# Patient Record
Sex: Female | Born: 1985
Health system: Southern US, Community
[De-identification: ages and names within clinical notes are randomized; demographics above are authoritative.]

## PROBLEM LIST (undated history)

## (undated) DIAGNOSIS — F322 Major depressive disorder, single episode, severe without psychotic features: Secondary | ICD-10-CM

## (undated) DIAGNOSIS — F419 Anxiety disorder, unspecified: Secondary | ICD-10-CM

## (undated) DIAGNOSIS — E119 Type 2 diabetes mellitus without complications: Secondary | ICD-10-CM

## (undated) DIAGNOSIS — D708 Other neutropenia: Secondary | ICD-10-CM

## (undated) HISTORY — PX: TONSILLECTOMY AND ADENOIDECTOMY: SHX28

## (undated) HISTORY — PX: KNEE SURGERY: SHX244

## (undated) HISTORY — PX: WISDOM TOOTH EXTRACTION: SHX21

## (undated) HISTORY — PX: TONSILLECTOMY: SUR1361

## (undated) HISTORY — DX: Anxiety disorder, unspecified: F41.9

---
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
## 2008-01-08 ENCOUNTER — Encounter: Admission: RE | Admit: 2008-01-08 | Discharge: 2008-01-08 | Payer: Self-pay | Admitting: Orthopedic Surgery

## 2014-06-15 LAB — HM DIABETES EYE EXAM

## 2014-07-15 LAB — HM PAP SMEAR: HM PAP: NORMAL

## 2014-07-27 ENCOUNTER — Inpatient Hospital Stay (HOSPITAL_BASED_OUTPATIENT_CLINIC_OR_DEPARTMENT_OTHER)
Admission: EM | Admit: 2014-07-27 | Discharge: 2014-07-30 | DRG: 639 | Disposition: A | Discharge status: Home / Self Care | Payer: BLUE CROSS/BLUE SHIELD | Attending: Family Medicine | Admitting: Family Medicine

## 2014-07-27 ENCOUNTER — Other Ambulatory Visit (HOSPITAL_BASED_OUTPATIENT_CLINIC_OR_DEPARTMENT_OTHER): Payer: Self-pay

## 2014-07-27 ENCOUNTER — Encounter (HOSPITAL_BASED_OUTPATIENT_CLINIC_OR_DEPARTMENT_OTHER): Payer: Self-pay | Admitting: *Deleted

## 2014-07-27 DIAGNOSIS — E1010 Type 1 diabetes mellitus with ketoacidosis without coma: Secondary | ICD-10-CM | POA: Insufficient documentation

## 2014-07-27 DIAGNOSIS — R0602 Shortness of breath: Secondary | ICD-10-CM | POA: Insufficient documentation

## 2014-07-27 DIAGNOSIS — E0810 Diabetes mellitus due to underlying condition with ketoacidosis without coma: Secondary | ICD-10-CM | POA: Diagnosis not present

## 2014-07-27 DIAGNOSIS — R109 Unspecified abdominal pain: Secondary | ICD-10-CM

## 2014-07-27 DIAGNOSIS — R531 Weakness: Secondary | ICD-10-CM | POA: Diagnosis present

## 2014-07-27 DIAGNOSIS — E1110 Type 2 diabetes mellitus with ketoacidosis without coma: Secondary | ICD-10-CM | POA: Diagnosis present

## 2014-07-27 DIAGNOSIS — E876 Hypokalemia: Secondary | ICD-10-CM | POA: Diagnosis present

## 2014-07-27 HISTORY — DX: Other neutropenia: D70.8

## 2014-07-27 HISTORY — DX: Type 2 diabetes mellitus without complications: E11.9

## 2014-07-27 LAB — BASIC METABOLIC PANEL
Anion gap: 18 — ABNORMAL HIGH (ref 5–15)
BUN: 8 mg/dL (ref 6–20)
CHLORIDE: 108 mmol/L (ref 101–111)
CO2: 8 mmol/L — AB (ref 22–32)
CREATININE: 0.81 mg/dL (ref 0.44–1.00)
Calcium: 8.3 mg/dL — ABNORMAL LOW (ref 8.9–10.3)
Glucose, Bld: 254 mg/dL — ABNORMAL HIGH (ref 65–99)
POTASSIUM: 3.2 mmol/L — AB (ref 3.5–5.1)
SODIUM: 134 mmol/L — AB (ref 135–145)

## 2014-07-27 LAB — CBC WITH DIFFERENTIAL/PLATELET
Basophils Absolute: 0 10*3/uL (ref 0.0–0.1)
Basophils Relative: 1 % (ref 0–1)
EOS ABS: 0 10*3/uL (ref 0.0–0.7)
EOS PCT: 1 % (ref 0–5)
HEMATOCRIT: 42 % (ref 36.0–46.0)
HEMOGLOBIN: 14.4 g/dL (ref 12.0–15.0)
LYMPHS ABS: 2.3 10*3/uL (ref 0.7–4.0)
LYMPHS PCT: 31 % (ref 12–46)
MCH: 33.3 pg (ref 26.0–34.0)
MCHC: 34.3 g/dL (ref 30.0–36.0)
MCV: 97.2 fL (ref 78.0–100.0)
Monocytes Absolute: 0.6 10*3/uL (ref 0.1–1.0)
Monocytes Relative: 8 % (ref 3–12)
NEUTROS PCT: 59 % (ref 43–77)
Neutro Abs: 4.5 10*3/uL (ref 1.7–7.7)
PLATELETS: 170 10*3/uL (ref 150–400)
RBC: 4.32 MIL/uL (ref 3.87–5.11)
RDW: 13.1 % (ref 11.5–15.5)
WBC: 7.4 10*3/uL (ref 4.0–10.5)

## 2014-07-27 LAB — URINALYSIS, ROUTINE W REFLEX MICROSCOPIC
BILIRUBIN URINE: NEGATIVE
Leukocytes, UA: NEGATIVE
Nitrite: NEGATIVE
PH: 5.5 (ref 5.0–8.0)
Protein, ur: 100 mg/dL — AB
Specific Gravity, Urine: 1.035 — ABNORMAL HIGH (ref 1.005–1.030)
Urobilinogen, UA: 0.2 mg/dL (ref 0.0–1.0)

## 2014-07-27 LAB — MAGNESIUM: Magnesium: 1.9 mg/dL (ref 1.7–2.4)

## 2014-07-27 LAB — HEPATIC FUNCTION PANEL
ALBUMIN: 3.8 g/dL (ref 3.5–5.0)
ALT: 16 U/L (ref 14–54)
AST: 15 U/L (ref 15–41)
Alkaline Phosphatase: 47 U/L (ref 38–126)
Bilirubin, Direct: <0.1 mg/dL — ABNORMAL LOW (ref 0.1–0.5)
TOTAL PROTEIN: 7.2 g/dL (ref 6.5–8.1)
Total Bilirubin: 1 mg/dL (ref 0.3–1.2)

## 2014-07-27 LAB — I-STAT ARTERIAL BLOOD GAS, ED
Acid-base deficit: 23 mmol/L — ABNORMAL HIGH (ref 0.0–2.0)
Bicarbonate: 5.4 mEq/L — ABNORMAL LOW (ref 20.0–24.0)
O2 Saturation: 94 %
Patient temperature: 97.3
TCO2: 6 mmol/L (ref 0–100)
pCO2 arterial: 17.9 mmHg — CL (ref 35.0–45.0)
pH, Arterial: 7.082 — CL (ref 7.350–7.450)
pO2, Arterial: 92 mmHg (ref 80.0–100.0)

## 2014-07-27 LAB — GLUCOSE, CAPILLARY: Glucose-Capillary: 206 mg/dL — ABNORMAL HIGH (ref 65–99)

## 2014-07-27 LAB — CBG MONITORING, ED
Glucose-Capillary: 259 mg/dL — ABNORMAL HIGH (ref 65–99)
Glucose-Capillary: 181 mg/dL — ABNORMAL HIGH (ref 65–99)
Glucose-Capillary: 178 mg/dL — ABNORMAL HIGH (ref 65–99)

## 2014-07-27 LAB — URINE MICROSCOPIC-ADD ON

## 2014-07-27 LAB — LIPASE, BLOOD: Lipase: 44 U/L (ref 22–51)

## 2014-07-27 LAB — I-STAT CG4 LACTIC ACID, ED: Lactic Acid, Venous: 1.33 mmol/L (ref 0.5–2.0)

## 2014-07-27 LAB — TROPONIN I: Troponin I: <0.03 ng/mL (ref <0.031)

## 2014-07-27 LAB — PREGNANCY, URINE: Preg Test, Ur: NEGATIVE

## 2014-07-27 MED ORDER — ACETAMINOPHEN 325 MG PO TABS
650.0000 mg | ORAL_TABLET | Freq: Once | ORAL | Status: AC
  Administered 2014-07-27: 650 mg via ORAL
  Filled 2014-07-27: qty 2

## 2014-07-27 MED ORDER — ONDANSETRON HCL 4 MG/2ML IJ SOLN
4.0000 mg | Freq: Once | INTRAMUSCULAR | Status: AC
  Administered 2014-07-27: 4 mg via INTRAVENOUS
  Filled 2014-07-27: qty 2

## 2014-07-27 MED ORDER — POTASSIUM CHLORIDE 10 MEQ/100ML IV SOLN
10.0000 meq | INTRAVENOUS | Status: DC
  Administered 2014-07-27 – 2014-07-28 (×4): 10 meq via INTRAVENOUS
  Filled 2014-07-27 (×4): qty 100

## 2014-07-27 MED ORDER — SODIUM CHLORIDE 0.9 % IV SOLN
INTRAVENOUS | Status: DC
  Administered 2014-07-27: 1.2 [IU]/h via INTRAVENOUS

## 2014-07-27 MED ORDER — SODIUM CHLORIDE 0.9 % IV SOLN
1000.0000 mL | INTRAVENOUS | Status: DC
Start: 2014-07-27 — End: 2014-07-28
  Administered 2014-07-27: 1000 mL via INTRAVENOUS

## 2014-07-27 MED ORDER — POTASSIUM CHLORIDE CRYS ER 20 MEQ PO TBCR
40.0000 meq | EXTENDED_RELEASE_TABLET | Freq: Once | ORAL | Status: AC
  Administered 2014-07-27: 40 meq via ORAL
  Filled 2014-07-27: qty 2

## 2014-07-27 MED ORDER — DEXTROSE-NACL 5-0.45 % IV SOLN
INTRAVENOUS | Status: DC
Start: 2014-07-27 — End: 2014-07-28
  Administered 2014-07-27: 21:00:00 via INTRAVENOUS

## 2014-07-27 MED ORDER — SODIUM CHLORIDE 0.9 % IV BOLUS (SEPSIS)
2000.0000 mL | Freq: Once | INTRAVENOUS | Status: AC
  Administered 2014-07-27: 2000 mL via INTRAVENOUS

## 2014-07-27 NOTE — ED Notes (Addendum)
[facility] as follows:   20G LAC: D5 0.45%NS @ 12650ml/hr and KCL 10meQ infusing at 100 ml/hr via 2 channel pump - compatible  20G RAC: NS bolus infusing with Insulin Regular gtt infusing @ 1.2 ml/hr via pump on one channel - compatible

## 2014-07-27 NOTE — ED Provider Notes (Signed)
CSN: [id number]     Arrival date & time 07/27/14  1853 History   First MD Initiated Contact with Patient 07/27/14 1859     Chief Complaint  Patient presents with  . Shortness of Breath     (Consider location/radiation/quality/duration/timing/severity/associated sxs/prior Treatment) HPI Michelle Riley is a 29 y.o. female who was recently diagnosed with type 1 diabetes on Tuesday and was initiated on metformin therapy. Patient is back on Monday to have insulin regimen started. States on Wednesday she began to have some nausea and is unsure if this was related to starting the metformin. On Friday she began to feel increasingly fatigued and endorses polyuria and polydipsia. Denies any abdominal pain, vomiting. However, she does report shortness of breath and increased work of breathing. She denies chest pain, numbness or weakness, recent travel or surgeries, hemoptysis, personal history of cancer, blood clot. Patient does use OCPs. CBG on arrival 259 Past Medical History  Diagnosis Date  . Diabetes mellitus without complication    Past Surgical History  Procedure Laterality Date  . Tonsillectomy     History reviewed. No pertinent family history. History  Substance Use Topics  . Smoking status: Never Smoker   . Smokeless tobacco: Not on file  . Alcohol Use: No   OB History    No data available     Review of Systems A 10 point review of systems was completed and was negative except for pertinent positives and negatives as mentioned in the history of present illness     Allergies  Review of patient's allergies indicates no known allergies.  Home Medications   Prior to Admission medications   Medication Sig Start Date End Date Taking? Authorizing Provider  metFORMIN (GLUCOPHAGE) 500 MG tablet Take by mouth 2 (two) times daily with a meal.   Yes Historical Provider, MD   BP 107/70 mmHg  Pulse 76  Temp(Src) 97.3 F (36.3 C) (Oral)  Resp 16  Ht 5\' 5"  (1.651 m)  Wt 116 lb  (52.617 kg)  BMI 19.30 kg/m2  SpO2 100%  LMP 07/20/2014 Physical Exam  Constitutional: She is oriented to person, place, and time. She appears well-developed and well-nourished.  HENT:  Head: Normocephalic and atraumatic.  Mouth/Throat: Oropharynx is clear and moist.  Eyes: Conjunctivae are normal. Pupils are equal, round, and reactive to light. Right eye exhibits no discharge. Left eye exhibits no discharge. No scleral icterus.  Neck: Normal range of motion. Neck supple.  Cardiovascular: Normal rate, regular rhythm and normal heart sounds.   Pulmonary/Chest: Effort normal and breath sounds normal. No respiratory distress. She has no wheezes. She has no rales.  Increased work of breathing noted. No accessory muscle use or nasal flaring. Lungs clear to auscultation bilaterally.  Abdominal: Soft.  Diffuse abdominal tenderness without focal tenderness. Mild distention. No rebound or guarding. No other lesions or deformities noted.  Musculoskeletal: She exhibits no tenderness.  Neurological: She is alert and oriented to person, place, and time.  Cranial Nerves II-XII grossly intact  Skin: Skin is warm and dry. No rash noted.  Psychiatric: She has a normal mood and affect.  Nursing note and vitals reviewed.   ED Course  Procedures (including critical care time) Labs Review Labs Reviewed  URINALYSIS, ROUTINE W REFLEX MICROSCOPIC - Abnormal; Notable for the following:    Specific Gravity, Urine 1.035 (*)    Glucose, UA >1000 (*)    Hgb urine dipstick TRACE (*)    Ketones, ur >80 (*)    Protein,  ur 100 (*)    All other components within normal limits  BASIC METABOLIC PANEL - Abnormal; Notable for the following:    Sodium 134 (*)    Potassium 3.2 (*)    CO2 8 (*)    Glucose, Bld 254 (*)    Calcium 8.3 (*)    Anion gap 18 (*)    All other components within normal limits  HEPATIC FUNCTION PANEL - Abnormal; Notable for the following:    Bilirubin, Direct <0.1 (*)    All other  components within normal limits  CBG MONITORING, ED - Abnormal; Notable for the following:    Glucose-Capillary 259 (*)    All other components within normal limits  CBG MONITORING, ED - Abnormal; Notable for the following:    Glucose-Capillary 181 (*)    All other components within normal limits  I-STAT ARTERIAL BLOOD GAS, ED - Abnormal; Notable for the following:    pH, Arterial 7.082 (*)    pCO2 arterial 17.9 (*)    Bicarbonate 5.4 (*)    Acid-base deficit 23.0 (*)    All other components within normal limits  CBG MONITORING, ED - Abnormal; Notable for the following:    Glucose-Capillary 178 (*)    All other components within normal limits  PREGNANCY, URINE  CBC WITH DIFFERENTIAL/PLATELET  URINE MICROSCOPIC-ADD ON  MAGNESIUM  TROPONIN I  LIPASE, BLOOD  BLOOD GAS, ARTERIAL  I-STAT CG4 LACTIC ACID, ED    Imaging Review No results found.   EKG Interpretation None     Meds given in ED:  Medications  insulin regular (NOVOLIN R,HUMULIN R) 250 Units in sodium chloride 0.9 % 250 mL (1 Units/mL) infusion (1.2 Units/hr Intravenous Rate/Dose Change 07/27/14 2140)  dextrose 5 %-0.45 % sodium chloride infusion ( Intravenous New Bag/Given 07/27/14 2121)  0.9 %  sodium chloride infusion (1,000 mLs Intravenous New Bag/Given 07/27/14 2015)  potassium chloride 10 mEq in 100 mL IVPB (10 mEq Intravenous New Bag/Given 07/27/14 2030)  sodium chloride 0.9 % bolus 2,000 mL (0 mLs Intravenous Stopped 07/27/14 2031)  ondansetron (ZOFRAN) injection 4 mg (4 mg Intravenous Given 07/27/14 1939)  potassium chloride SA (K-DUR,KLOR-CON) CR tablet 40 mEq (40 mEq Oral Given 07/27/14 2121)  ondansetron (ZOFRAN) injection 4 mg (4 mg Intravenous Given 07/27/14 2121)  acetaminophen (TYLENOL) tablet 650 mg (650 mg Oral Given 07/27/14 2121)    New Prescriptions   No medications on file   Filed Vitals:   07/27/14 1945 07/27/14 2035 07/27/14 2133 07/27/14 2200  BP:   118/82 107/70  Pulse: 84  77 76  Temp:       TempSrc:      Resp: 18  17 16   Height:      Weight:  116 lb (52.617 kg)    SpO2: 100%  100% 100%    MDM  Otherwise healthy, 29 year old female with newly diagnosed type 1 diabetes comes in for evaluation of fatigue and shortness of breath. Patient found to be in DKA. ABG shows pH of 7.07, PCO2 18.5 bicarbonate 5.4. Ketones in urine. Gap of 18.  CBG on arrival 259, vital signs stable and afebrile.  Diffuse abdominal tenderness with nausea. No focal tenderness and I doubt any acute/infectious intra-abdominal pathology. Respirations have improved after IV fluids and insulin in the ED, no tachypnea. Patient started on glucose stabilizer in the ED. I discussed and reviewed this case with my attending, Dr. Juleen ChinaKohut who also saw and evaluated the patient and agrees with plan for admission for  DKA. Consultation internal medicine, Dr. Algie Coffer, patient admitted to [facility]down at [facility]. Dr. Algie Coffer requests LFTs and lipase and we will oblige. Patient is stable, nontoxic in appearance and appropriate for transfer at this time.  Final diagnoses:  Diabetic ketoacidosis without coma associated with type 1 diabetes mellitus        Joycie Peek, PA-C 07/27/14 2238  Raeford Razor, MD 07/27/14 [phone]

## 2014-07-27 NOTE — ED Notes (Signed)
Pt c/o SOb x 3 days ne dx DM x 1 week ago  BS 270

## 2014-07-28 ENCOUNTER — Inpatient Hospital Stay (HOSPITAL_COMMUNITY): Payer: BLUE CROSS/BLUE SHIELD

## 2014-07-28 ENCOUNTER — Encounter (HOSPITAL_COMMUNITY): Payer: Self-pay | Admitting: Internal Medicine

## 2014-07-28 DIAGNOSIS — E1010 Type 1 diabetes mellitus with ketoacidosis without coma: Secondary | ICD-10-CM | POA: Insufficient documentation

## 2014-07-28 DIAGNOSIS — R0602 Shortness of breath: Secondary | ICD-10-CM | POA: Insufficient documentation

## 2014-07-28 DIAGNOSIS — E1310 Other specified diabetes mellitus with ketoacidosis without coma: Secondary | ICD-10-CM

## 2014-07-28 LAB — BASIC METABOLIC PANEL
ANION GAP: 7 (ref 5–15)
Anion gap: 10 (ref 5–15)
Anion gap: 7 (ref 5–15)
Anion gap: 10 (ref 5–15)
Anion gap: 11 (ref 5–15)
BUN: <5 mg/dL — ABNORMAL LOW (ref 6–20)
BUN: <5 mg/dL — ABNORMAL LOW (ref 6–20)
BUN: <5 mg/dL — ABNORMAL LOW (ref 6–20)
BUN: <5 mg/dL — ABNORMAL LOW (ref 6–20)
CALCIUM: 6.8 mg/dL — AB (ref 8.9–10.3)
CALCIUM: 7.3 mg/dL — AB (ref 8.9–10.3)
CHLORIDE: 113 mmol/L — AB (ref 101–111)
CO2: 10 mmol/L — AB (ref 22–32)
CO2: 13 mmol/L — ABNORMAL LOW (ref 22–32)
CO2: 15 mmol/L — AB (ref 22–32)
CO2: 13 mmol/L — ABNORMAL LOW (ref 22–32)
CO2: 15 mmol/L — ABNORMAL LOW (ref 22–32)
Calcium: 7.8 mg/dL — ABNORMAL LOW (ref 8.9–10.3)
Calcium: 7.9 mg/dL — ABNORMAL LOW (ref 8.9–10.3)
Calcium: 7.8 mg/dL — ABNORMAL LOW (ref 8.9–10.3)
Chloride: 112 mmol/L — ABNORMAL HIGH (ref 101–111)
Chloride: 112 mmol/L — ABNORMAL HIGH (ref 101–111)
Chloride: 111 mmol/L (ref 101–111)
Chloride: 108 mmol/L (ref 101–111)
Creatinine, Ser: 0.73 mg/dL (ref 0.44–1.00)
Creatinine, Ser: 0.65 mg/dL (ref 0.44–1.00)
Creatinine, Ser: 0.61 mg/dL (ref 0.44–1.00)
Creatinine, Ser: 0.7 mg/dL (ref 0.44–1.00)
Creatinine, Ser: 0.61 mg/dL (ref 0.44–1.00)
GFR calc Af Amer: >60 mL/min (ref >60)
GFR calc Af Amer: >60 mL/min (ref >60)
GFR calc Af Amer: >60 mL/min (ref >60)
GFR calc Af Amer: >60 mL/min (ref >60)
GFR calc Af Amer: >60 mL/min (ref >60)
GFR calc non Af Amer: >60 mL/min (ref >60)
GFR calc non Af Amer: >60 mL/min (ref >60)
GFR calc non Af Amer: >60 mL/min (ref >60)
GFR calc non Af Amer: >60 mL/min (ref >60)
GLUCOSE: 157 mg/dL — AB (ref 65–99)
Glucose, Bld: 132 mg/dL — ABNORMAL HIGH (ref 65–99)
Glucose, Bld: 139 mg/dL — ABNORMAL HIGH (ref 65–99)
Glucose, Bld: 210 mg/dL — ABNORMAL HIGH (ref 65–99)
Glucose, Bld: 190 mg/dL — ABNORMAL HIGH (ref 65–99)
POTASSIUM: 2.8 mmol/L — AB (ref 3.5–5.1)
POTASSIUM: 3.1 mmol/L — AB (ref 3.5–5.1)
Potassium: 2.4 mmol/L — CL (ref 3.5–5.1)
Potassium: 3 mmol/L — ABNORMAL LOW (ref 3.5–5.1)
Potassium: 2.9 mmol/L — ABNORMAL LOW (ref 3.5–5.1)
Sodium: 132 mmol/L — ABNORMAL LOW (ref 135–145)
Sodium: 133 mmol/L — ABNORMAL LOW (ref 135–145)
Sodium: 134 mmol/L — ABNORMAL LOW (ref 135–145)
Sodium: 134 mmol/L — ABNORMAL LOW (ref 135–145)
Sodium: 134 mmol/L — ABNORMAL LOW (ref 135–145)

## 2014-07-28 LAB — GLUCOSE, CAPILLARY
GLUCOSE-CAPILLARY: 146 mg/dL — AB (ref 65–99)
GLUCOSE-CAPILLARY: 129 mg/dL — AB (ref 65–99)
GLUCOSE-CAPILLARY: 116 mg/dL — AB (ref 65–99)
GLUCOSE-CAPILLARY: 163 mg/dL — AB (ref 65–99)
GLUCOSE-CAPILLARY: 178 mg/dL — AB (ref 65–99)
GLUCOSE-CAPILLARY: 126 mg/dL — AB (ref 65–99)
GLUCOSE-CAPILLARY: 154 mg/dL — AB (ref 65–99)
Glucose-Capillary: 155 mg/dL — ABNORMAL HIGH (ref 65–99)
Glucose-Capillary: 160 mg/dL — ABNORMAL HIGH (ref 65–99)
Glucose-Capillary: 146 mg/dL — ABNORMAL HIGH (ref 65–99)
Glucose-Capillary: 199 mg/dL — ABNORMAL HIGH (ref 65–99)
Glucose-Capillary: 132 mg/dL — ABNORMAL HIGH (ref 65–99)
Glucose-Capillary: 138 mg/dL — ABNORMAL HIGH (ref 65–99)
Glucose-Capillary: 151 mg/dL — ABNORMAL HIGH (ref 65–99)
Glucose-Capillary: 154 mg/dL — ABNORMAL HIGH (ref 65–99)
Glucose-Capillary: 218 mg/dL — ABNORMAL HIGH (ref 65–99)
Glucose-Capillary: 145 mg/dL — ABNORMAL HIGH (ref 65–99)
Glucose-Capillary: 192 mg/dL — ABNORMAL HIGH (ref 65–99)
Glucose-Capillary: 199 mg/dL — ABNORMAL HIGH (ref 65–99)
Glucose-Capillary: 209 mg/dL — ABNORMAL HIGH (ref 65–99)
Glucose-Capillary: 126 mg/dL — ABNORMAL HIGH (ref 65–99)
Glucose-Capillary: 161 mg/dL — ABNORMAL HIGH (ref 65–99)

## 2014-07-28 LAB — CBC WITH DIFFERENTIAL/PLATELET
Basophils Absolute: 0 10*3/uL (ref 0.0–0.1)
Basophils Relative: 1 % (ref 0–1)
EOS ABS: 0.1 10*3/uL (ref 0.0–0.7)
Eosinophils Relative: 2 % (ref 0–5)
HCT: 35.4 % — ABNORMAL LOW (ref 36.0–46.0)
Hemoglobin: 12.1 g/dL (ref 12.0–15.0)
LYMPHS PCT: 40 % (ref 12–46)
Lymphs Abs: 2.3 10*3/uL (ref 0.7–4.0)
MCH: 32.2 pg (ref 26.0–34.0)
MCHC: 34.2 g/dL (ref 30.0–36.0)
MCV: 94.1 fL (ref 78.0–100.0)
Monocytes Absolute: 0.6 10*3/uL (ref 0.1–1.0)
Monocytes Relative: 10 % (ref 3–12)
NEUTROS PCT: 47 % (ref 43–77)
Neutro Abs: 2.8 10*3/uL (ref 1.7–7.7)
Platelets: 146 10*3/uL — ABNORMAL LOW (ref 150–400)
RBC: 3.76 MIL/uL — ABNORMAL LOW (ref 3.87–5.11)
RDW: 13.6 % (ref 11.5–15.5)
WBC: 5.8 10*3/uL (ref 4.0–10.5)

## 2014-07-28 LAB — SALICYLATE LEVEL: Salicylate Lvl: <4 mg/dL (ref 2.8–30.0)

## 2014-07-28 LAB — CBC
HCT: 35.3 % — ABNORMAL LOW (ref 36.0–46.0)
HEMOGLOBIN: 12 g/dL (ref 12.0–15.0)
MCH: 32.3 pg (ref 26.0–34.0)
MCHC: 34 g/dL (ref 30.0–36.0)
MCV: 94.9 fL (ref 78.0–100.0)
PLATELETS: 145 10*3/uL — AB (ref 150–400)
RBC: 3.72 MIL/uL — ABNORMAL LOW (ref 3.87–5.11)
RDW: 13.5 % (ref 11.5–15.5)
WBC: 6.5 10*3/uL (ref 4.0–10.5)

## 2014-07-28 LAB — TROPONIN I: Troponin I: <0.03 ng/mL (ref <0.031)

## 2014-07-28 LAB — MRSA PCR SCREENING: MRSA by PCR: NEGATIVE

## 2014-07-28 MED ORDER — DEXTROSE-NACL 5-0.45 % IV SOLN
INTRAVENOUS | Status: DC
  Administered 2014-07-28 – 2014-07-29 (×3): via INTRAVENOUS

## 2014-07-28 MED ORDER — POTASSIUM CHLORIDE 10 MEQ/100ML IV SOLN
10.0000 meq | INTRAVENOUS | Status: AC
  Administered 2014-07-28 (×2): 10 meq via INTRAVENOUS
  Filled 2014-07-28 (×2): qty 100

## 2014-07-28 MED ORDER — INSULIN REGULAR HUMAN 100 UNIT/ML IJ SOLN: INTRAMUSCULAR | Status: DC

## 2014-07-28 MED ORDER — METOCLOPRAMIDE HCL 5 MG/ML IJ SOLN
10.0000 mg | Freq: Once | INTRAMUSCULAR | Status: AC
  Administered 2014-07-28: 10 mg via INTRAVENOUS
  Filled 2014-07-28: qty 2

## 2014-07-28 MED ORDER — DIPHENHYDRAMINE HCL 50 MG/ML IJ SOLN
25.0000 mg | Freq: Once | INTRAMUSCULAR | Status: AC
  Administered 2014-07-28: 25 mg via INTRAVENOUS
  Filled 2014-07-28: qty 1

## 2014-07-28 MED ORDER — MORPHINE SULFATE 2 MG/ML IJ SOLN
1.0000 mg | Freq: Once | INTRAMUSCULAR | Status: AC
  Administered 2014-07-28: 1 mg via INTRAVENOUS
  Filled 2014-07-28: qty 1

## 2014-07-28 MED ORDER — KETOROLAC TROMETHAMINE 15 MG/ML IJ SOLN
15.0000 mg | Freq: Once | INTRAMUSCULAR | Status: AC
  Administered 2014-07-28: 15 mg via INTRAVENOUS
  Filled 2014-07-28: qty 1

## 2014-07-28 MED ORDER — POTASSIUM CHLORIDE 10 MEQ/100ML IV SOLN: 10.0000 meq | INTRAVENOUS | Status: DC

## 2014-07-28 MED ORDER — KETOROLAC TROMETHAMINE 30 MG/ML IJ SOLN
30.0000 mg | Freq: Once | INTRAMUSCULAR | Status: AC
  Administered 2014-07-28: 30 mg via INTRAVENOUS
  Filled 2014-07-28: qty 1

## 2014-07-28 MED ORDER — SODIUM CHLORIDE 0.9 % IV SOLN
INTRAVENOUS | Status: DC
  Administered 2014-07-29 (×2): via INTRAVENOUS

## 2014-07-28 MED ORDER — POTASSIUM CHLORIDE CRYS ER 20 MEQ PO TBCR: 60.0000 meq | EXTENDED_RELEASE_TABLET | Freq: Once | ORAL | Status: DC

## 2014-07-28 MED ORDER — POTASSIUM CHLORIDE 10 MEQ/100ML IV SOLN
10.0000 meq | INTRAVENOUS | Status: AC
  Administered 2014-07-28 (×2): 10 meq via INTRAVENOUS
  Filled 2014-07-28 (×3): qty 100

## 2014-07-28 MED ORDER — POTASSIUM CHLORIDE 10 MEQ/100ML IV SOLN
10.0000 meq | INTRAVENOUS | Status: AC
  Administered 2014-07-28 (×2): 10 meq via INTRAVENOUS
  Filled 2014-07-28: qty 100

## 2014-07-28 MED ORDER — POTASSIUM CHLORIDE CRYS ER 20 MEQ PO TBCR
60.0000 meq | EXTENDED_RELEASE_TABLET | Freq: Once | ORAL | Status: AC
  Administered 2014-07-28: 60 meq via ORAL
  Filled 2014-07-28: qty 3

## 2014-07-28 MED ORDER — IBUPROFEN 200 MG PO TABS
600.0000 mg | ORAL_TABLET | Freq: Once | ORAL | Status: AC | PRN
  Administered 2014-07-28: 600 mg via ORAL
  Filled 2014-07-28: qty 3

## 2014-07-28 MED ORDER — ACETAMINOPHEN 325 MG PO TABS
650.0000 mg | ORAL_TABLET | Freq: Four times a day (QID) | ORAL | Status: DC | PRN
Start: 2014-07-28 — End: 2014-07-30
  Administered 2014-07-28 – 2014-07-29 (×2): 650 mg via ORAL
  Filled 2014-07-28: qty 2

## 2014-07-28 MED ORDER — METOCLOPRAMIDE HCL 5 MG/ML IJ SOLN
5.0000 mg | Freq: Once | INTRAMUSCULAR | Status: AC
  Administered 2014-07-28: 5 mg via INTRAVENOUS
  Filled 2014-07-28: qty 1

## 2014-07-28 MED ORDER — POTASSIUM CHLORIDE 10 MEQ/100ML IV SOLN
10.0000 meq | INTRAVENOUS | Status: AC
  Administered 2014-07-28 (×3): 10 meq via INTRAVENOUS
  Filled 2014-07-28 (×3): qty 100

## 2014-07-28 MED ORDER — ENOXAPARIN SODIUM 40 MG/0.4ML ~~LOC~~ SOLN
40.0000 mg | Freq: Every day | SUBCUTANEOUS | Status: DC
  Administered 2014-07-28 – 2014-07-29 (×2): 40 mg via SUBCUTANEOUS
  Filled 2014-07-28 (×3): qty 0.4

## 2014-07-28 NOTE — Progress Notes (Signed)
Critical K 2.4 called to [facility], ordered to receive 4 runs KCL.

## 2014-07-28 NOTE — H&P (Signed)
[facility] History and Physical  Michelle Riley WUJ:[id number] DOB: [birth year] DOA: 07/27/2014  Referring physician: Patient was transferred from [facility] [place]. PCP: No primary care provider on file. [facility] [facility] [place] [facility]. Specialists: None.  Chief Complaint: Weakness and fatigue and shortness of breath.  HPI: Michelle Riley is a 29 y.o. female who was diagnosed with diabetes mellitus and placed on metformin 2 days ago presents to the ER because of weakness fatigue and nausea. Patient also has been having shortness of breath. All these symptoms started yesterday morning. In the ER patient was found to have elevated blood sugar with anion gap. Patient was given 3 L normal saline bolus and started on IV insulin infusion for DKA. Patient denies any chest pain vomiting diarrhea fever chills. Patient on exam has abdominal tenderness in the both upper quadrants. Denies any dysuria or discharges. Patient at this time on my exam states her shortness of breath has largely resolved.    Review of Systems: As presented in the history of presenting illness, rest negative.  Past Medical History  Diagnosis Date  . Diabetes mellitus without complication   . Chronic benign neutropenia    Past Surgical History  Procedure Laterality Date  . Tonsillectomy    . Knee surgery    . Tonsillectomy and adenoidectomy     Social History:  reports that she has never smoked. She does not have any smokeless tobacco history on file. She reports that she does not drink alcohol or use illicit drugs. Where does patient livehome. Can patient participate in ADLs? yes. No Known Allergies  Family History:  Family History  Problem Relation Age of Onset  . Diabetes Mellitus II Maternal Grandmother   . Diabetes Mellitus II Paternal Grandfather   . Leukemia Paternal Grandfather       Prior to Admission medications   Medication Sig Start Date End Date Taking? Authorizing  Provider  metFORMIN (GLUCOPHAGE) 500 MG tablet Take by mouth 2 (two) times daily with a meal.   Yes Historical Provider, MD    Physical Exam: Filed Vitals:   07/27/14 2035 07/27/14 2133 07/27/14 2200 07/27/14 2315  BP:  118/82 107/70 97/65  Pulse:  77 76 83  Temp:    98.1 F (36.7 C)  TempSrc:    Oral  Resp:  17 16 16   Height:      Weight: 52.617 kg (116 lb)     SpO2:  100% 100% 100%     General:  Well-developed and nourished.  Eyes: Anicteric no pallor.  ENT: No discharge from the ears eyes nose or mouth.  Neck: No mass felt.  Cardiovascular: S1-S2 heard.  Respiratory: No rhonchi or crepitations.  Abdomen: Mild tenderness in the both upper quadrants. No guarding or rigidity.  Skin: No rash.  Musculoskeletal: No edema.  Psychiatric: Appears normal.  Neurologic: Alert awake oriented to time place and person. Moves all extremities.  Labs on Admission:  Basic Metabolic Panel:  Recent Labs Lab 07/27/14 1915  NA 134*  K 3.2*  CL 108  CO2 8*  GLUCOSE 254*  BUN 8  CREATININE 0.81  CALCIUM 8.3*  MG 1.9   Liver Function Tests:  Recent Labs Lab 07/27/14 1909  AST 15  ALT 16  ALKPHOS 47  BILITOT 1.0  PROT 7.2  ALBUMIN 3.8    Recent Labs Lab 07/27/14 1909  LIPASE 44   No results for input(s): AMMONIA in the last 168 hours. CBC:  Recent  Labs Lab 07/27/14 1915  WBC 7.4  NEUTROABS 4.5  HGB 14.4  HCT 42.0  MCV 97.2  PLT 170   Cardiac Enzymes:  Recent Labs Lab 07/27/14 1915  TROPONINI <0.03    BNP (last 3 results) No results for input(s): BNP in the last 8760 hours.  ProBNP (last 3 results) No results for input(s): PROBNP in the last 8760 hours.  CBG:  Recent Labs Lab 07/27/14 1907 07/27/14 2017 07/27/14 2154 07/27/14 2309  GLUCAP 259* 181* 178* 206*    Radiological Exams on Admission: No results found.  EKG: Independently reviewed. Normal sinus rhythm with prolonged QT of 525 ms.  Assessment/Plan Principal  Problem:   DKA (diabetic ketoacidoses) Active Problems:   Diabetic ketoacidosis without coma associated with type 1 diabetes mellitus   1. Diabetic ketoacidosis probably type I - patient has been continued on IV insulin infusion with IV fluids. Closely follow metabolic panel. Once patient's anion gap is corrected we will change to long-acting insulin. Patient states her hemoglobin A1c checked 3 days ago was 13. Closely follow intake output and metabolic panel. Check salicylate levels. Chest x-ray is pending. 2. Abnormal EKG with prolonged QT - recheck EKG after correction of electrolytes. 3. Abdominal tenderness - patient has abdominal tenderness in the both upper quadrants. LFTs are normal. Check sonogram of abdomen.  Chest x-ray is pending.   DVT Prophylaxis Lovenox  Code Status: Full code.  Family Communication: Family at the bedside.  Disposition Plan: Admit to inpatient. Likely stay 2 days.    Lamario Mani N. [facility] Pager [phone].  If 7PM-7AM, please contact night-coverage [email or web address] Password [email or web address] 07/28/2014, 12:17 AM

## 2014-07-28 NOTE — Progress Notes (Addendum)
Initial Nutrition Assessment  DOCUMENTATION CODES:  Not applicable  INTERVENTION:   (Advance diet as medically appropriate, RD to add interventions accordingly)  NUTRITION DIAGNOSIS:  Inadequate oral intake related to inability to eat as evidenced by NPO status  GOAL:  Patient will meet greater than or equal to 90% of their needs  MONITOR:  Diet advancement, PO intake, Labs, Weight trends, I & O's  REASON FOR ASSESSMENT:  Malnutrition Screening Tool  ASSESSMENT: 29 y.o. Female who was diagnosed with diabetes mellitus and placed on metformin 2 days ago presents to the ER because of weakness fatigue and nausea.   RD unable to obtain nutrition hx.    Pt currently in ULTRASOUND.  Per Malnutrition Screening Tool Report, pt with recent weight loss without trying, eating poorly because of a decreased appetite.  Currently NPO.  RD to continue to follow, add supplements as needed.  RD unable to complete Nutrition Focused Physical Exam at this time.  Height:  Ht Readings from Last 1 Encounters:  07/27/14 5\' 5"  (1.651 m)    Weight:  Wt Readings from Last 1 Encounters:  07/27/14 116 lb (52.617 kg)    Ideal Body Weight:  56.8 kg  Wt Readings from Last 10 Encounters:  07/27/14 116 lb (52.617 kg)    BMI:  Body mass index is 19.3 kg/(m^2).  Estimated Nutritional Needs:  Kcal:  1700-1900  Protein:  80-90 gm  Fluid:  1.7-1.9 L  Skin:  Reviewed, no issues  Diet Order:  Diet NPO time specified  EDUCATION NEEDS:  No education needs identified at this time   Intake/Output Summary (Last 24 hours) at 07/28/14 1224 Last data filed at 07/28/14 1150  Gross per 24 hour  Intake 2408.76 ml  Output      0 ml  Net 2408.76 ml    Last BM:  unknown   Maureen ChattersKatie Nyara Capell, RD, LDN Pager #: [phone] After-Hours Pager #: [phone]

## 2014-07-28 NOTE — Progress Notes (Signed)
UR COMPLETED  

## 2014-07-28 NOTE — Progress Notes (Signed)
Patient seen and evaluated earlier the same by my associate. Please refer to H&P for details. Patient has required multiple potassium replacements. Still acidotic and as such will continue glucose stabilizer.  Gen: pt in nad, alert and awake CV: S1 and S2 within normal limits, no rubs Pulmonary: Clear to auscultation  Continue monitoring BMP q 4 hours. Once there is improvement in acidosis will plan on placing on SQ insulin regimen. Patient still complaining of nausea.  Jazarah Capili, [employer]

## 2014-07-28 NOTE — Care Management Note (Signed)
Case Management Note  Patient Details  Name: Michelle Riley MRN: [id number] Date of Birth: 1985-12-08  Subjective/Objective:             Pt  presents with weakness and fatigue,admitted with DKA  from home with family.      Action/Plan: Return to home when medically stable. CM to f/u with discharge needs.  Expected Discharge Date:                  Expected Discharge Plan:  Home/Self Care  In-House Referral:     Discharge planning Services  CM Consult  Post Acute Care Choice:    Choice offered to:     DME Arranged:    DME Agency:     HH Arranged:    HH Agency:     Status of Service:  In process, will continue to follow  Medicare Important Message Given:    Date Medicare IM Given:    Medicare IM give by:    Date Additional Medicare IM Given:    Additional Medicare Important Message give by:     If discussed at Long Length of Stay Meetings, dates discussed:    Additional Comments:    Emergency contact Michelle Riley (mother) [phone]  Epifanio LeschesCole, Amberia Bayless Hudson, RN 07/28/2014, 7:02 AM

## 2014-07-29 DIAGNOSIS — E1010 Type 1 diabetes mellitus with ketoacidosis without coma: Principal | ICD-10-CM

## 2014-07-29 DIAGNOSIS — E876 Hypokalemia: Secondary | ICD-10-CM

## 2014-07-29 LAB — BASIC METABOLIC PANEL
ANION GAP: 11 (ref 5–15)
ANION GAP: 10 (ref 5–15)
Anion gap: 10 (ref 5–15)
BUN: <5 mg/dL — ABNORMAL LOW (ref 6–20)
BUN: <5 mg/dL — ABNORMAL LOW (ref 6–20)
CALCIUM: 8 mg/dL — AB (ref 8.9–10.3)
CHLORIDE: 108 mmol/L (ref 101–111)
CO2: 17 mmol/L — ABNORMAL LOW (ref 22–32)
CO2: 19 mmol/L — AB (ref 22–32)
CO2: 19 mmol/L — ABNORMAL LOW (ref 22–32)
CREATININE: 0.71 mg/dL (ref 0.44–1.00)
Calcium: 7.8 mg/dL — ABNORMAL LOW (ref 8.9–10.3)
Calcium: 8 mg/dL — ABNORMAL LOW (ref 8.9–10.3)
Chloride: 108 mmol/L (ref 101–111)
Chloride: 108 mmol/L (ref 101–111)
Creatinine, Ser: 0.58 mg/dL (ref 0.44–1.00)
Creatinine, Ser: 0.46 mg/dL (ref 0.44–1.00)
GFR calc Af Amer: >60 mL/min (ref >60)
GFR calc non Af Amer: >60 mL/min (ref >60)
GFR calc non Af Amer: >60 mL/min (ref >60)
GFR calc non Af Amer: >60 mL/min (ref >60)
GLUCOSE: 176 mg/dL — AB (ref 65–99)
GLUCOSE: 388 mg/dL — AB (ref 65–99)
Glucose, Bld: 191 mg/dL — ABNORMAL HIGH (ref 65–99)
POTASSIUM: 3.4 mmol/L — AB (ref 3.5–5.1)
Potassium: 2.9 mmol/L — ABNORMAL LOW (ref 3.5–5.1)
Potassium: 2.8 mmol/L — ABNORMAL LOW (ref 3.5–5.1)
SODIUM: 138 mmol/L (ref 135–145)
SODIUM: 137 mmol/L (ref 135–145)
Sodium: 135 mmol/L (ref 135–145)

## 2014-07-29 LAB — GLUCOSE, CAPILLARY
GLUCOSE-CAPILLARY: 123 mg/dL — AB (ref 65–99)
GLUCOSE-CAPILLARY: 182 mg/dL — AB (ref 65–99)
GLUCOSE-CAPILLARY: 175 mg/dL — AB (ref 65–99)
Glucose-Capillary: 120 mg/dL — ABNORMAL HIGH (ref 65–99)
Glucose-Capillary: 149 mg/dL — ABNORMAL HIGH (ref 65–99)
Glucose-Capillary: 159 mg/dL — ABNORMAL HIGH (ref 65–99)
Glucose-Capillary: 160 mg/dL — ABNORMAL HIGH (ref 65–99)
Glucose-Capillary: 165 mg/dL — ABNORMAL HIGH (ref 65–99)
Glucose-Capillary: 166 mg/dL — ABNORMAL HIGH (ref 65–99)
Glucose-Capillary: 212 mg/dL — ABNORMAL HIGH (ref 65–99)
Glucose-Capillary: 219 mg/dL — ABNORMAL HIGH (ref 65–99)
Glucose-Capillary: 232 mg/dL — ABNORMAL HIGH (ref 65–99)
Glucose-Capillary: 318 mg/dL — ABNORMAL HIGH (ref 65–99)
Glucose-Capillary: 392 mg/dL — ABNORMAL HIGH (ref 65–99)

## 2014-07-29 MED ORDER — POTASSIUM CHLORIDE 10 MEQ/100ML IV SOLN
10.0000 meq | INTRAVENOUS | Status: AC
  Administered 2014-07-29 (×4): 10 meq via INTRAVENOUS
  Filled 2014-07-29 (×3): qty 100

## 2014-07-29 MED ORDER — INSULIN ASPART 100 UNIT/ML ~~LOC~~ SOLN
0.0000 [IU] | Freq: Three times a day (TID) | SUBCUTANEOUS | Status: DC
  Administered 2014-07-29: 2 [IU] via SUBCUTANEOUS
  Administered 2014-07-29: 7 [IU] via SUBCUTANEOUS
  Administered 2014-07-30: 2 [IU] via SUBCUTANEOUS
  Administered 2014-07-30: 3 [IU] via SUBCUTANEOUS

## 2014-07-29 MED ORDER — INSULIN GLARGINE 100 UNIT/ML ~~LOC~~ SOLN
5.0000 [IU] | Freq: Every day | SUBCUTANEOUS | Status: DC
  Administered 2014-07-29 – 2014-07-30 (×2): 5 [IU] via SUBCUTANEOUS
  Filled 2014-07-29 (×2): qty 0.05

## 2014-07-29 MED ORDER — INSULIN ASPART 100 UNIT/ML ~~LOC~~ SOLN
0.0000 [IU] | Freq: Every day | SUBCUTANEOUS | Status: DC
  Administered 2014-07-29: 5 [IU] via SUBCUTANEOUS

## 2014-07-29 MED ORDER — IBUPROFEN 200 MG PO TABS
600.0000 mg | ORAL_TABLET | Freq: Once | ORAL | Status: AC | PRN
Start: 2014-07-29 — End: 2014-07-29
  Administered 2014-07-29: 600 mg via ORAL
  Filled 2014-07-29: qty 3

## 2014-07-29 MED ORDER — POTASSIUM CHLORIDE CRYS ER 20 MEQ PO TBCR
40.0000 meq | EXTENDED_RELEASE_TABLET | Freq: Once | ORAL | Status: AC
  Administered 2014-07-29: 40 meq via ORAL
  Filled 2014-07-29: qty 2

## 2014-07-29 MED ORDER — POTASSIUM CHLORIDE 10 MEQ/100ML IV SOLN
10.0000 meq | INTRAVENOUS | Status: AC
  Administered 2014-07-29 (×4): 10 meq via INTRAVENOUS
  Filled 2014-07-29 (×4): qty 100

## 2014-07-29 NOTE — Progress Notes (Signed)
[facility] PROGRESS NOTE  Michelle LargeJessica D Riley UJW:[id number] DOB: 1985/12/07 DOA: 07/27/2014 PCP: No primary care provider on file.  Assessment/Plan: Principal Problem:   DKA (diabetic ketoacidoses) - Newly diagnosed diabetes - Patient was successfully transitioned off of insulin drip earlier this a.m. - Close metabolic anion gap although co2 still below normal limits but hypokalemia was affecting further use of insulin gtt.  - advance diet - place on Lantus with SSI and night coverage  Hypokalemia - replace orally and IV - Magnesium on last check normal  Code Status: full Family Communication: discussed with patient directly  Disposition Plan: Pending improvement in condition. Likely d/c next am if continued improvement in condition and glucose levels   Consultants:  None  Procedures:  None  Antibiotics:  None   HPI/Subjective: Pt has no new complaints no acute issues reported overnight.  Objective: Filed Vitals:   07/29/14 1547  BP: 104/62  Pulse: 85  Temp:   Resp: 18    Intake/Output Summary (Last 24 hours) at 07/29/14 1605 Last data filed at 07/29/14 1546  Gross per 24 hour  Intake 5418.73 ml  Output      0 ml  Net 5418.73 ml   Filed Weights   07/27/14 1858 07/27/14 2035 07/29/14 1547  Weight: 58.06 kg (128 lb) 52.617 kg (116 lb) 61.417 kg (135 lb 6.4 oz)    Exam:   General:  Pt in nad, alert and awake  Cardiovascular: No cyanosis  Respiratory: No increased work of breathing, no wheezes, equal chest rise  Abdomen: Nondistended, no guarding  Musculoskeletal: No cyanosis or clubbing, normal muscle tone bilaterally  Data Reviewed: Basic Metabolic Panel:  Recent Labs Lab 07/27/14 1915  07/28/14 1119 07/28/14 1630 07/28/14 2250 07/29/14 0245 07/29/14 1131  NA 134*  < > 134* 134* 134* 135 138  K 3.2*  < > 3.1* 3.0* 2.9* 2.9* 2.8*  CL 108  < > 112* 111 108 108 108  CO2 8*  < > 15* 13* 15* 17* 19*  GLUCOSE 254*  < > 139* 210*  190* 191* 176*  BUN 8  < > <5* <5* <5* <5* <5*  CREATININE 0.81  < > 0.61 0.70 0.61 0.58 0.46  CALCIUM 8.3*  < > 7.8* 7.9* 7.8* 7.8* 8.0*  MG 1.9  --   --   --   --   --   --   < > = values in this interval not displayed. Liver Function Tests:  Recent Labs Lab 07/27/14 1909  AST 15  ALT 16  ALKPHOS 47  BILITOT 1.0  PROT 7.2  ALBUMIN 3.8    Recent Labs Lab 07/27/14 1909  LIPASE 44   No results for input(s): AMMONIA in the last 168 hours. CBC:  Recent Labs Lab 07/27/14 1915 07/28/14 0106 07/28/14 0441  WBC 7.4 6.5 5.8  NEUTROABS 4.5  --  2.8  HGB 14.4 12.0 12.1  HCT 42.0 35.3* 35.4*  MCV 97.2 94.9 94.1  PLT 170 145* 146*   Cardiac Enzymes:  Recent Labs Lab 07/27/14 1915 07/28/14 0106  TROPONINI <0.03 <0.03   BNP (last 3 results) No results for input(s): BNP in the last 8760 hours.  ProBNP (last 3 results) No results for input(s): PROBNP in the last 8760 hours.  CBG:  Recent Labs Lab 07/29/14 0643 07/29/14 0746 07/29/14 0843 07/29/14 0946 07/29/14 1123  GLUCAP 160* 149* 166* 182* 175*    Recent Results (from the past 240 hour(s))  MRSA PCR Screening  Status: None   Collection Time: 07/27/14 11:38 PM  Result Value Ref Range Status   MRSA by PCR NEGATIVE NEGATIVE Final    Comment:        The GeneXpert MRSA Assay (FDA approved for NASAL specimens only), is one component of a comprehensive MRSA colonization surveillance program. It is not intended to diagnose MRSA infection nor to guide or monitor treatment for MRSA infections.      Studies: [place] Abdomen Complete  07/28/2014   CLINICAL DATA:  Abdominal pain.  EXAM: ULTRASOUND ABDOMEN COMPLETE  COMPARISON:  None.  FINDINGS: Gallbladder: No gallstones or wall thickening visualized. No sonographic Murphy sign noted.  Common bile duct: Diameter: 3.3 mm  Liver: No focal lesion identified. Within normal limits in parenchymal echogenicity.  IVC: No abnormality visualized.  Pancreas: Visualized  portion unremarkable.  Spleen: Size and appearance within normal limits.  Right Kidney: Length: 11.6 cm. Echogenicity within normal limits. No mass or hydronephrosis visualized.  Left Kidney: Length: 11.5 cm. Echogenicity within normal limits. No mass or hydronephrosis visualized.  Abdominal aorta: No aneurysm visualized.  Other findings: None.  IMPRESSION: Normal abdominal ultrasound.   Electronically Signed   By: Amie Portlandavid  Ormond M.D.   On: 07/28/2014 09:23   Dg Chest Port 1 View  07/28/2014   CLINICAL DATA:  Shortness of breath, weakness, and nausea for 2 days.  EXAM: PORTABLE CHEST - 1 VIEW  COMPARISON:  None.  FINDINGS: The heart size and mediastinal contours are within normal limits. Both lungs are clear. The visualized skeletal structures are unremarkable.  IMPRESSION: No active disease.   Electronically Signed   By: Burman NievesWilliam  Stevens M.D.   On: 07/28/2014 05:31    Scheduled Meds: . enoxaparin (LOVENOX) injection  40 mg Subcutaneous Daily  . insulin aspart  0-5 Units Subcutaneous QHS  . insulin aspart  0-9 Units Subcutaneous TID WC  . insulin glargine  5 Units Subcutaneous Daily  . potassium chloride  10 mEq Intravenous Q1 Hr x 4   Continuous Infusions: . sodium chloride 125 mL/hr at 07/29/14 1317  . dextrose 5 % and 0.45% NaCl 100 mL/hr at 07/29/14 0033  . insulin (NOVOLIN-R) infusion Stopped (07/29/14 1035)     Time spent: > 35 minutes    Penny PiaVEGA, Aislinn Feliz  [facility] Pager [phone]  If 7PM-7AM, please contact night-coverage at [email or web address], password [email or web address] 07/29/2014, 4:05 PM  LOS: 2 days

## 2014-07-30 DIAGNOSIS — E0810 Diabetes mellitus due to underlying condition with ketoacidosis without coma: Secondary | ICD-10-CM

## 2014-07-30 LAB — BASIC METABOLIC PANEL
ANION GAP: 9 (ref 5–15)
BUN: <5 mg/dL — ABNORMAL LOW (ref 6–20)
CALCIUM: 7.9 mg/dL — AB (ref 8.9–10.3)
CO2: 22 mmol/L (ref 22–32)
Chloride: 109 mmol/L (ref 101–111)
Creatinine, Ser: 0.6 mg/dL (ref 0.44–1.00)
GFR calc Af Amer: >60 mL/min (ref >60)
GFR calc non Af Amer: >60 mL/min (ref >60)
GLUCOSE: 263 mg/dL — AB (ref 65–99)
Potassium: 3.3 mmol/L — ABNORMAL LOW (ref 3.5–5.1)
Sodium: 140 mmol/L (ref 135–145)

## 2014-07-30 LAB — GLUCOSE, CAPILLARY
GLUCOSE-CAPILLARY: 248 mg/dL — AB (ref 65–99)
Glucose-Capillary: 198 mg/dL — ABNORMAL HIGH (ref 65–99)

## 2014-07-30 MED ORDER — INSULIN STARTER KIT- PEN NEEDLES (ENGLISH)
1.0000 | Freq: Once | Status: AC
  Administered 2014-07-30: 1
  Filled 2014-07-30: qty 1

## 2014-07-30 MED ORDER — INSULIN ASPART 100 UNIT/ML FLEXPEN: 0.0000 [IU] | PEN_INJECTOR | Freq: Three times a day (TID) | SUBCUTANEOUS | Status: DC

## 2014-07-30 MED ORDER — INSULIN DETEMIR 100 UNIT/ML FLEXPEN: 10.0000 [IU] | PEN_INJECTOR | Freq: Every day | SUBCUTANEOUS | Status: DC

## 2014-07-30 MED ORDER — INSULIN ASPART 100 UNIT/ML ~~LOC~~ SOLN: 0.0000 [IU] | Freq: Three times a day (TID) | SUBCUTANEOUS | Status: DC

## 2014-07-30 MED ORDER — LIVING WELL WITH DIABETES BOOK
Freq: Once | Status: AC
  Administered 2014-07-30: 13:00:00
  Filled 2014-07-30: qty 1

## 2014-07-30 MED ORDER — INSULIN DETEMIR 100 UNIT/ML ~~LOC~~ SOLN: 10.0000 [IU] | Freq: Every day | SUBCUTANEOUS | Status: DC

## 2014-07-30 NOTE — Discharge Summary (Signed)
Physician Discharge Summary  Michelle Riley JXB:[id number] DOB: March 03, 1986 DOA: 07/27/2014  PCP: No primary care provider on file.  Admit date: 07/27/2014 Discharge date: 07/30/2014  Time spent: > 35 minutes  Recommendations for Outpatient Follow-up:  1. Please monitor blood sugars and adjust hypoglycemic agents as necessary  Discharge Diagnoses:  Principal Problem:   DKA (diabetic ketoacidoses) Active Problems:   Diabetic ketoacidosis without coma associated with type 1 diabetes mellitus   SOB (shortness of breath)   Discharge Condition: stable  Diet recommendation: Diabetic diet  Filed Weights   07/27/14 1858 07/27/14 2035 07/29/14 1547  Weight: 128 lb (58.06 kg) 116 lb (52.617 kg) 135 lb 6.4 oz (61.417 kg)    History of present illness:  29 year old with recently diagnosed diabetes who presented in DKA  Hospital Course:   DKA - Resolved on glucose stabilizer - Successfully transitioned to subcutaneous insulin. Will discharge on long-acting insulin regimen Levemir 10 units subcutaneous daily and novolog sliding scale.   Procedures: None  Consultations:  None  Discharge Exam: Filed Vitals:   07/30/14 1359  BP: 98/58  Pulse: 82  Temp: 98.6 F (37 C)  Resp: 18    General: Pt in nad, alert and awake Cardiovascular: rrr, no mrg Respiratory: cta bl, no wheezes  Discharge Instructions   Discharge Instructions    Ambulatory referral to Nutrition and Diabetic Education    Complete by:  As directed   New Onset Type 1 diabetes     Call MD for:  redness, tenderness, or signs of infection (pain, swelling, redness, odor or green/yellow discharge around incision site)    Complete by:  As directed      Call MD for:  temperature >100.4    Complete by:  As directed      Diet - low sodium heart healthy    Complete by:  As directed      Discharge instructions    Complete by:  As directed      Increase activity slowly    Complete by:  As directed            Current Discharge Medication List    START taking these medications   Details  insulin aspart (NOVOLOG) 100 UNIT/ML injection Inject 0-9 Units into the skin 3 (three) times daily with meals.  CBG 70 - 120: 0 units    CBG 121 - 150: 1 unit    CBG 151 - 200: 2 units    CBG 201 - 250: 3 units    CBG 251 - 300: 5 units    CBG 301 - 350: 7 units    CBG 351 - 400 9 units Qty: 10 mL, Refills: 0    insulin detemir (LEVEMIR) 100 UNIT/ML injection Inject 0.1 mLs (10 Units total) into the skin daily. Qty: 10 mL, Refills: 0      CONTINUE these medications which have NOT CHANGED   Details  ACCU-CHEK SOFTCLIX LANCETS lancets Refills: 11    aspirin-acetaminophen-caffeine (EXCEDRIN MIGRAINE) 250-250-65 MG per tablet Take by mouth every 6 (six) hours as needed for migraine.    MONONESSA 0.25-35 MG-MCG tablet Take 1 tablet by mouth at bedtime.  Refills: 1       No Known Allergies    The results of significant diagnostics from this hospitalization (including imaging, microbiology, ancillary and laboratory) are listed below for reference.    Significant Diagnostic Studies: [place] Abdomen Complete  07/28/2014   CLINICAL DATA:  Abdominal pain.  EXAM: ULTRASOUND ABDOMEN COMPLETE  COMPARISON:  None.  FINDINGS: Gallbladder: No gallstones or wall thickening visualized. No sonographic Murphy sign noted.  Common bile duct: Diameter: 3.3 mm  Liver: No focal lesion identified. Within normal limits in parenchymal echogenicity.  IVC: No abnormality visualized.  Pancreas: Visualized portion unremarkable.  Spleen: Size and appearance within normal limits.  Right Kidney: Length: 11.6 cm. Echogenicity within normal limits. No mass or hydronephrosis visualized.  Left Kidney: Length: 11.5 cm. Echogenicity within normal limits. No mass or hydronephrosis visualized.  Abdominal aorta: No aneurysm visualized.  Other findings: None.  IMPRESSION: Normal abdominal ultrasound.   Electronically Signed   By: Amie Portland M.D.   On: 07/28/2014 09:23   Dg Chest Port 1 View  07/28/2014   CLINICAL DATA:  Shortness of breath, weakness, and nausea for 2 days.  EXAM: PORTABLE CHEST - 1 VIEW  COMPARISON:  None.  FINDINGS: The heart size and mediastinal contours are within normal limits. Both lungs are clear. The visualized skeletal structures are unremarkable.  IMPRESSION: No active disease.   Electronically Signed   By: Burman Nieves M.D.   On: 07/28/2014 05:31    Microbiology: Recent Results (from the past 240 hour(s))  MRSA PCR Screening     Status: None   Collection Time: 07/27/14 11:38 PM  Result Value Ref Range Status   MRSA by PCR NEGATIVE NEGATIVE Final    Comment:        The GeneXpert MRSA Assay (FDA approved for NASAL specimens only), is one component of a comprehensive MRSA colonization surveillance program. It is not intended to diagnose MRSA infection nor to guide or monitor treatment for MRSA infections.      Labs: Basic Metabolic Panel:  Recent Labs Lab 07/27/14 1915  07/28/14 2250 07/29/14 0245 07/29/14 1131 07/29/14 1643 07/30/14 0640  NA 134*  < > 134* 135 138 137 140  K 3.2*  < > 2.9* 2.9* 2.8* 3.4* 3.3*  CL 108  < > 108 108 108 108 109  CO2 8*  < > 15* 17* 19* 19* 22  GLUCOSE 254*  < > 190* 191* 176* 388* 263*  BUN 8  < > <5* <5* <5* <5* <5*  CREATININE 0.81  < > 0.61 0.58 0.46 0.71 0.60  CALCIUM 8.3*  < > 7.8* 7.8* 8.0* 8.0* 7.9*  MG 1.9  --   --   --   --   --   --   < > = values in this interval not displayed. Liver Function Tests:  Recent Labs Lab 07/27/14 1909  AST 15  ALT 16  ALKPHOS 47  BILITOT 1.0  PROT 7.2  ALBUMIN 3.8    Recent Labs Lab 07/27/14 1909  LIPASE 44   No results for input(s): AMMONIA in the last 168 hours. CBC:  Recent Labs Lab 07/27/14 1915 07/28/14 0106 07/28/14 0441  WBC 7.4 6.5 5.8  NEUTROABS 4.5  --  2.8  HGB 14.4 12.0 12.1  HCT 42.0 35.3* 35.4*  MCV 97.2 94.9 94.1  PLT 170 145* 146*   Cardiac  Enzymes:  Recent Labs Lab 07/27/14 1915 07/28/14 0106  TROPONINI <0.03 <0.03   BNP: BNP (last 3 results) No results for input(s): BNP in the last 8760 hours.  ProBNP (last 3 results) No results for input(s): PROBNP in the last 8760 hours.  CBG:  Recent Labs Lab 07/29/14 1123 07/29/14 1710 07/29/14 2103 07/30/14 0805 07/30/14 1227  GLUCAP 175* 318* 392* 248* 198*     Signed:  Kindal Ponti,  Collyn Ribas  [facility] 07/30/2014, 3:34 PM

## 2014-07-30 NOTE — Progress Notes (Addendum)
Inpatient Diabetes Program Recommendations  AACE/ADA: New Consensus Statement on Inpatient Glycemic Control (2013)  Target Ranges:  Prepandial:   less than 140 mg/dL      Peak postprandial:   less than 180 mg/dL (1-2 hours)      Critically ill patients:  140 - 180 mg/dL   Reason for Visit: Results for Michelle LargeKOWALSKI, Michelle Riley (MRN [id number]) as of 07/30/2014 16:07  Ref. Range 07/29/2014 11:23 07/29/2014 17:10 07/29/2014 21:03 07/30/2014 08:05 07/30/2014 12:27  Glucose-Capillary Latest Ref Range: 65-99 mg/dL 409175 (H) 811318 (H) 914392 (H) 248 (H) 198 (H)    Diabetes history: New onset diabetes, likely Type 1 according to MD's note  Patient states that she had labs drawn in March, 2016 which indicated high glucose=337 mg/dL.  She then scheduled a physical in which her PCP, Dr. Yetta BarreJones started her on Metformin and also ordered a "C-peptide" and A1C.  She was called last Friday with her lab results which showed an A1C of 13.0% and a low c-peptide.  She was told that she would need to start insulin today, however if she because SOB or sick, she should seek care at the ER.  Her family took her to the ER on Friday night.    She reports a 6 lb weight loss in the past week.  Discussed in detail Type 1 diabetes diagnosis including insulin administration, types of insulin monitoring, hypoglycemia treatment and hyperglycemia.  Gave patient "Type 1" booklet from pediatric unit and also JDRF bag for adults.  Parents and patient asked great questions and were able to teach back.  Showed insulin pen to patient and allowed her to return demonstration.  She is very smart however overwhelmed with new diagnosis which is to be expected.  Emotional support given.  Called and discussed with Dr. Cena BentonVega.  To discharge today.  Re-emphasized survival skills with patient.   Will follow.  Thanks, Beryl MeagerJenny Jomaira Darr, RN, BC-ADM Inpatient Diabetes Coordinator Pager [phone])  1630  Called and spoke with Dr. Cena BentonVega regarding possibility  of insulin pens instead of vial and syringe.  He states he will order for discharge.  Also patient states she will call her PCP to request a follow-up appointment with an endocrinologist.

## 2014-07-30 NOTE — Progress Notes (Signed)
All IV access and Telemetry monitor are discontinued. Patient informed about discharge instructions and discharge medications. Pt to be discharged home on wheelchair by staff. All pts belongings sent with pt.  

## 2014-07-30 NOTE — Plan of Care (Signed)
Problem: Food- and Nutrition-Related Knowledge Deficit (NB-1.1) Goal: Nutrition education Formal process to instruct or train a patient/client in a skill or to impart knowledge to help patients/clients voluntarily manage or modify food choices and eating behavior to maintain or improve health. Outcome: Adequate for Discharge  RD consulted for nutrition education regarding diabetes.   No results found for: HGBA1C  RD provided "Carbohydrate Counting for People with Diabetes" handout from the Academy of Nutrition and Dietetics. Discussed different food groups and their effects on blood sugar, emphasizing carbohydrate-containing foods. Provided list of carbohydrates and recommended serving sizes of common foods.  Discussed importance of controlled and consistent carbohydrate intake throughout the day. Provided examples of ways to balance meals/snacks and encouraged intake of high-fiber, whole grain complex carbohydrates. Teach back method used.  Expect very good compliance.  Body mass index is 22.53 kg/(m^2). Pt meets criteria for normal weight range based on current BMI.  Current diet order is Carb Modified, patient is consuming approximately 75% of meals at this time. Labs and medications reviewed. No further nutrition interventions warranted at this time. RD contact information provided. If additional nutrition issues arise, please re-consult RD.  Keriana Sarsfield A. Mayford KnifeWilliams, RD, LDN, CDE Pager: [phone] After hours Pager: [phone]

## 2014-08-05 ENCOUNTER — Encounter (HOSPITAL_BASED_OUTPATIENT_CLINIC_OR_DEPARTMENT_OTHER): Payer: Self-pay | Admitting: *Deleted

## 2014-08-05 ENCOUNTER — Emergency Department (HOSPITAL_BASED_OUTPATIENT_CLINIC_OR_DEPARTMENT_OTHER)
Admission: EM | Admit: 2014-08-05 | Discharge: 2014-08-05 | Disposition: A | Discharge status: Home / Self Care | Payer: BLUE CROSS/BLUE SHIELD | Attending: Emergency Medicine | Admitting: Emergency Medicine

## 2014-08-05 DIAGNOSIS — E1165 Type 2 diabetes mellitus with hyperglycemia: Secondary | ICD-10-CM | POA: Diagnosis not present

## 2014-08-05 DIAGNOSIS — Z79899 Other long term (current) drug therapy: Secondary | ICD-10-CM | POA: Insufficient documentation

## 2014-08-05 DIAGNOSIS — E860 Dehydration: Secondary | ICD-10-CM | POA: Diagnosis not present

## 2014-08-05 DIAGNOSIS — Z794 Long term (current) use of insulin: Secondary | ICD-10-CM | POA: Insufficient documentation

## 2014-08-05 DIAGNOSIS — R739 Hyperglycemia, unspecified: Secondary | ICD-10-CM

## 2014-08-05 DIAGNOSIS — E876 Hypokalemia: Secondary | ICD-10-CM | POA: Diagnosis not present

## 2014-08-05 DIAGNOSIS — F419 Anxiety disorder, unspecified: Secondary | ICD-10-CM | POA: Diagnosis not present

## 2014-08-05 DIAGNOSIS — R0602 Shortness of breath: Secondary | ICD-10-CM | POA: Diagnosis present

## 2014-08-05 DIAGNOSIS — R109 Unspecified abdominal pain: Secondary | ICD-10-CM | POA: Diagnosis not present

## 2014-08-05 DIAGNOSIS — Z86018 Personal history of other benign neoplasm: Secondary | ICD-10-CM | POA: Insufficient documentation

## 2014-08-05 LAB — CBC WITH DIFFERENTIAL/PLATELET
BASOS PCT: 1 % (ref 0–1)
Basophils Absolute: 0 10*3/uL (ref 0.0–0.1)
Eosinophils Absolute: 0 10*3/uL (ref 0.0–0.7)
Eosinophils Relative: 1 % (ref 0–5)
HCT: 42.1 % (ref 36.0–46.0)
HEMOGLOBIN: 14.4 g/dL (ref 12.0–15.0)
Lymphocytes Relative: 37 % (ref 12–46)
Lymphs Abs: 1.7 10*3/uL (ref 0.7–4.0)
MCH: 32.8 pg (ref 26.0–34.0)
MCHC: 34.2 g/dL (ref 30.0–36.0)
MCV: 95.9 fL (ref 78.0–100.0)
Monocytes Absolute: 0.5 10*3/uL (ref 0.1–1.0)
Monocytes Relative: 10 % (ref 3–12)
Neutro Abs: 2.5 10*3/uL (ref 1.7–7.7)
Neutrophils Relative %: 51 % (ref 43–77)
Platelets: 216 10*3/uL (ref 150–400)
RBC: 4.39 MIL/uL (ref 3.87–5.11)
RDW: 13.1 % (ref 11.5–15.5)
WBC: 4.7 10*3/uL (ref 4.0–10.5)

## 2014-08-05 LAB — I-STAT VENOUS BLOOD GAS, ED
BICARBONATE: 20.1 meq/L (ref 20.0–24.0)
O2 Saturation: 89 %
PO2 VEN: 45 mmHg (ref 30.0–45.0)
TCO2: 21 mmol/L (ref 0–100)
pCO2, Ven: 21.2 mmHg — ABNORMAL LOW (ref 45.0–50.0)
pH, Ven: 7.583 — ABNORMAL HIGH (ref 7.250–7.300)

## 2014-08-05 LAB — COMPREHENSIVE METABOLIC PANEL
ALBUMIN: 3.8 g/dL (ref 3.5–5.0)
ALT: 15 U/L (ref 14–54)
AST: 20 U/L (ref 15–41)
Alkaline Phosphatase: 43 U/L (ref 38–126)
Anion gap: 16 — ABNORMAL HIGH (ref 5–15)
BUN: 21 mg/dL — AB (ref 6–20)
CO2: 20 mmol/L — ABNORMAL LOW (ref 22–32)
Calcium: 9.6 mg/dL (ref 8.9–10.3)
Chloride: 100 mmol/L — ABNORMAL LOW (ref 101–111)
Creatinine, Ser: 0.77 mg/dL (ref 0.44–1.00)
GFR calc Af Amer: >60 mL/min (ref >60)
GFR calc non Af Amer: >60 mL/min (ref >60)
GLUCOSE: 224 mg/dL — AB (ref 65–99)
POTASSIUM: 2.9 mmol/L — AB (ref 3.5–5.1)
SODIUM: 136 mmol/L (ref 135–145)
Total Bilirubin: 0.1 mg/dL — ABNORMAL LOW (ref 0.3–1.2)
Total Protein: 7.4 g/dL (ref 6.5–8.1)

## 2014-08-05 LAB — URINALYSIS, ROUTINE W REFLEX MICROSCOPIC
Bilirubin Urine: NEGATIVE
GLUCOSE, UA: 500 mg/dL — AB
Hgb urine dipstick: NEGATIVE
KETONES UR: NEGATIVE mg/dL
Leukocytes, UA: NEGATIVE
NITRITE: NEGATIVE
PH: 6 (ref 5.0–8.0)
Protein, ur: NEGATIVE mg/dL
SPECIFIC GRAVITY, URINE: 1.018 (ref 1.005–1.030)
Urobilinogen, UA: 0.2 mg/dL (ref 0.0–1.0)

## 2014-08-05 LAB — BASIC METABOLIC PANEL
ANION GAP: 8 (ref 5–15)
BUN: 14 mg/dL (ref 6–20)
CHLORIDE: 108 mmol/L (ref 101–111)
CO2: 24 mmol/L (ref 22–32)
Calcium: 7.6 mg/dL — ABNORMAL LOW (ref 8.9–10.3)
Creatinine, Ser: 0.48 mg/dL (ref 0.44–1.00)
GFR calc Af Amer: >60 mL/min (ref >60)
Glucose, Bld: 106 mg/dL — ABNORMAL HIGH (ref 65–99)
Potassium: 3.2 mmol/L — ABNORMAL LOW (ref 3.5–5.1)
Sodium: 140 mmol/L (ref 135–145)

## 2014-08-05 LAB — CBG MONITORING, ED
Glucose-Capillary: 225 mg/dL — ABNORMAL HIGH (ref 65–99)
Glucose-Capillary: 120 mg/dL — ABNORMAL HIGH (ref 65–99)

## 2014-08-05 MED ORDER — POTASSIUM CHLORIDE 10 MEQ/100ML IV SOLN
10.0000 meq | Freq: Once | INTRAVENOUS | Status: AC
  Administered 2014-08-05: 10 meq via INTRAVENOUS
  Filled 2014-08-05: qty 100

## 2014-08-05 MED ORDER — SODIUM CHLORIDE 0.9 % IV BOLUS (SEPSIS)
1000.0000 mL | Freq: Once | INTRAVENOUS | Status: AC
  Administered 2014-08-05: 1000 mL via INTRAVENOUS

## 2014-08-05 MED ORDER — INSULIN ASPART 100 UNIT/ML ~~LOC~~ SOLN
5.0000 [IU] | Freq: Once | SUBCUTANEOUS | Status: AC
  Administered 2014-08-05: 5 [IU] via SUBCUTANEOUS
  Filled 2014-08-05: qty 1

## 2014-08-05 MED ORDER — MAGNESIUM SULFATE IN D5W 10-5 MG/ML-% IV SOLN
1.0000 g | Freq: Once | INTRAVENOUS | Status: AC
  Administered 2014-08-05: 1 g via INTRAVENOUS
  Filled 2014-08-05: qty 100

## 2014-08-05 MED ORDER — POTASSIUM CHLORIDE CRYS ER 20 MEQ PO TBCR
40.0000 meq | EXTENDED_RELEASE_TABLET | Freq: Once | ORAL | Status: AC
  Administered 2014-08-05: 40 meq via ORAL
  Filled 2014-08-05: qty 2

## 2014-08-05 MED ORDER — MAGNESIUM SULFATE 50 % IJ SOLN
INTRAMUSCULAR | Status: AC
  Filled 2014-08-05: qty 2

## 2014-08-05 MED ORDER — LORAZEPAM 2 MG/ML IJ SOLN
1.0000 mg | Freq: Once | INTRAMUSCULAR | Status: AC
  Administered 2014-08-05: 1 mg via INTRAVENOUS
  Filled 2014-08-05: qty 1

## 2014-08-05 NOTE — ED Notes (Signed)
Placed on cont cardiac monitor with cont POX monitoring

## 2014-08-05 NOTE — ED Notes (Signed)
Resting quietly, appears comfortable, cont on cardiac monitor with cont POX and int NBP freq assessment

## 2014-08-05 NOTE — ED Notes (Signed)
Family at bedside. 

## 2014-08-05 NOTE — ED Notes (Signed)
EDP notified of pt's condition 

## 2014-08-05 NOTE — ED Notes (Signed)
MD at bedside. 

## 2014-08-05 NOTE — ED Notes (Signed)
Cont on cardiac monitor

## 2014-08-05 NOTE — ED Notes (Signed)
Newly dx with type 1 diabetes on 07/26/2104- presents with SOB x 2-3 hours- states has not been able to get blood sugar below 250

## 2014-08-05 NOTE — ED Notes (Signed)
12 lead complete, to EDP

## 2014-08-05 NOTE — ED Provider Notes (Signed)
CSN: [id number]     Arrival date & time 08/05/14  1515 History  This chart was scribed for Zadie Rhineonald Lucia Mccreadie, MD by Lyndel SafeKaitlyn Shelton, ED Scribe. This patient was seen in room MH04/MH04 and the patient's care was started 3:40 PM.  Chief Complaint  Patient presents with  . Shortness of Breath   Patient is a 29 y.o. female presenting with shortness of breath. The history is provided by the patient. No language interpreter was used.  Shortness of Breath Severity:  Severe Onset quality:  Sudden Duration:  3 hours Timing:  Constant Progression:  Worsening Chronicity:  New Relieved by:  None tried Worsened by:  Nothing tried Ineffective treatments:  None tried Associated symptoms: abdominal pain   Associated symptoms: no chest pain, no cough, no fever, no headaches and no vomiting     HPI Comments: Michelle Riley is a 29 y.o. female with PMHx of IDDM who presents to the Emergency Department complaining of hyperglycemia and severe SOB onset a few hours ago with associated gradually worsening mild abdominal pain, light-headedness, dizziness, and weakness. Pt states she was diagnosed type 1 DM 8 days ago and was [facility]. She states that her blood sugar has remained elevated above 250. She has not been working due to symptoms. Pt has an appointment with the endocrinologist in 2 days. Pt denies associated fevers, nausea, vomiting, CP, back pain,or dysuria. Patient denies prior hx of PE or blood clots.  Past Medical History  Diagnosis Date  . Diabetes mellitus without complication   . Chronic benign neutropenia    Past Surgical History  Procedure Laterality Date  . Tonsillectomy    . Knee surgery    . Tonsillectomy and adenoidectomy     Family History  Problem Relation Age of Onset  . Diabetes Mellitus II Maternal Grandmother   . Diabetes Mellitus II Paternal Grandfather   . Leukemia Paternal Grandfather    History  Substance Use Topics  . Smoking status: Never  Smoker   . Smokeless tobacco: Never Used  . Alcohol Use: No   OB History    No data available     Review of Systems  Constitutional: Negative for fever.  Respiratory: Positive for shortness of breath. Negative for cough and chest tightness.   Cardiovascular: Negative for chest pain.  Gastrointestinal: Positive for abdominal pain. Negative for nausea, vomiting and diarrhea.  Genitourinary: Negative for dysuria.  Musculoskeletal: Negative for back pain.  Neurological: Positive for dizziness, weakness and light-headedness. Negative for headaches.  All other systems reviewed and are negative.  Allergies  Review of patient's allergies indicates no known allergies.  Home Medications   Prior to Admission medications   Medication Sig Start Date End Date Taking? Authorizing Provider  ACCU-CHEK SOFTCLIX LANCETS lancets  07/27/14  Yes Historical Provider, MD  aspirin-acetaminophen-caffeine (EXCEDRIN MIGRAINE) [phone] MG per tablet Take by mouth every 6 (six) hours as needed for migraine.   Yes Historical Provider, MD  insulin aspart (NOVOLOG FLEXPEN) 100 UNIT/ML FlexPen Inject 0-9 Units into the skin 3 (three) times daily with meals. 07/30/14  Yes Penny Piarlando Vega, MD  Insulin Detemir (LEVEMIR) 100 UNIT/ML Pen Inject 10 Units into the skin daily at 12 noon. 07/30/14  Yes Penny Piarlando Vega, MD  MONONESSA 0.25-35 MG-MCG tablet Take 1 tablet by mouth at bedtime.  07/23/14  Yes Historical Provider, MD   Triage Vitals: BP 117/70 mmHg  Pulse 92  Temp(Src) 97.4 F (36.3 C) (Oral)  Resp 36  Ht 5'  5" (1.651 m)  Wt 134 lb (60.782 kg)  BMI 22.30 kg/m2  SpO2 100%  LMP 07/20/2014  Physical Exam   CONSTITUTIONAL: Well developed/well nourished, anxious and hyperventilating. She does not smell ketotic HEAD: Normocephalic/atraumatic EYES: EOMI/PERRL ENMT: Mucous membranes moist NECK: supple no meningeal signs SPINE/BACK:entire spine nontender CV: S1/S2 noted, no murmurs/rubs/gallops noted LUNGS: Lungs  are clear to auscultation bilaterally, tachypneic ABDOMEN: soft, nontender, no rebound or guarding, bowel sounds noted throughout abdomen GU:no cva tenderness NEURO: Pt is awake/alert/appropriate, moves all extremitiesx4.  No facial droop.   EXTREMITIES: pulses normal/equal, full ROM, no calf tenderness or edema  SKIN: warm, color normal PSYCH: anxious   ED Course  Procedures  DIAGNOSTIC STUDIES: Oxygen Saturation is 100% on RA, normal by my interpretation.    COORDINATION OF CARE: 3:49 PM Discussed plans to order diagnostic EKG, urinalysis, and lab work. Will give patient IV fluids and Ativan. Discussed treatment plan with pt to get EKG at bedside and pt agreed to plan. 9:18 PM Pt initially very anxious.  She has responded to IV fluids and ativan Her bicarb is 20.  Her potassium I feel with fluids/insulin and correction of potassium she would likely be safe for d/c as she does not appear acidotic at this time.  She is comfortable with this plan.  She is not currently tachypneic and no hypoxia 9:18 PM Pt receiving IV KCL She is in no distress Mild hypotension but this has been present previously per records Repeat CBG improved 9:42 PM Pt well appearing, using phone, no distress Labs improved Potassium improved No anion gap Pt feels well for d/c home She has endocrinology appt in 2 days Discussed strict return precautions We looked at her sliding scale and she will increase insulin by one unit once it is over 250  Labs Review Labs Reviewed  COMPREHENSIVE METABOLIC PANEL - Abnormal; Notable for the following:    Potassium 2.9 (*)    Chloride 100 (*)    CO2 20 (*)    Glucose, Bld 224 (*)    BUN 21 (*)    Total Bilirubin 0.1 (*)    Anion gap 16 (*)    All other components within normal limits  URINALYSIS, ROUTINE W REFLEX MICROSCOPIC - Abnormal; Notable for the following:    APPearance CLOUDY (*)    Glucose, UA 500 (*)    All other components within normal limits  BASIC  METABOLIC PANEL - Abnormal; Notable for the following:    Potassium 3.2 (*)    Glucose, Bld 106 (*)    Calcium 7.6 (*)    All other components within normal limits  CBG MONITORING, ED - Abnormal; Notable for the following:    Glucose-Capillary 225 (*)    All other components within normal limits  I-STAT VENOUS BLOOD GAS, ED - Abnormal; Notable for the following:    pH, Ven 7.583 (*)    pCO2, Ven 21.2 (*)    All other components within normal limits  CBG MONITORING, ED - Abnormal; Notable for the following:    Glucose-Capillary 120 (*)    All other components within normal limits  CBC WITH DIFFERENTIAL/PLATELET  BLOOD GAS, VENOUS   Medications  sodium chloride 0.9 % bolus 1,000 mL (0 mLs Intravenous Stopped 08/05/14 1637)  LORazepam (ATIVAN) injection 1 mg (1 mg Intravenous Given 08/05/14 1550)  insulin aspart (novoLOG) injection 5 Units (5 Units Subcutaneous Given 08/05/14 1651)  sodium chloride 0.9 % bolus 1,000 mL (1,000 mLs Intravenous New Bag/Given 08/05/14  1639)  potassium chloride SA (K-DUR,KLOR-CON) CR tablet 40 mEq (40 mEq Oral Given 08/05/14 1653)  potassium chloride 10 mEq in 100 mL IVPB (10 mEq Intravenous New Bag/Given 08/05/14 1656)  magnesium sulfate IVPB 1 g 100 mL (0 g Intravenous Stopped 08/05/14 1738)  magnesium sulfate 50 % (IV Push/IM) injection (  Duplicate 08/05/14 1652)      EKG Interpretation   Date/Time:  Sunday Aug 05 2014 16:08:19 EDT Ventricular Rate:  67 PR Interval:  176 QRS Duration: 88 QT Interval:  432 QTC Calculation: 456 R Axis:   77 Text Interpretation:  Normal sinus rhythm Nonspecific T wave abnormality  Abnormal ECG t wave abnormality noted in prior Confirmed by Bebe Shaggy  MD,  Dorinda Hill ([id number]) on 08/05/2014 4:11:10 PM     MDM   Final diagnoses:  Dehydration  Hyperglycemia  Hypokalemia    Nursing notes including past medical history and social history reviewed and considered in documentation Labs/vital reviewed myself and considered  during evaluation Previous records reviewed and considered   I personally performed the services described in this documentation, which was scribed in my presence. The recorded information has been reviewed and is accurate.       Zadie Rhine, MD 08/05/14 2149

## 2014-08-29 ENCOUNTER — Encounter: Payer: Self-pay | Admitting: *Deleted

## 2014-08-29 ENCOUNTER — Encounter: Payer: BLUE CROSS/BLUE SHIELD | Attending: Endocrinology | Admitting: *Deleted

## 2014-08-29 VITALS — Ht 65.0 in | Wt 148.2 lb

## 2014-08-29 DIAGNOSIS — Z794 Long term (current) use of insulin: Secondary | ICD-10-CM | POA: Diagnosis not present

## 2014-08-29 DIAGNOSIS — Z713 Dietary counseling and surveillance: Secondary | ICD-10-CM | POA: Insufficient documentation

## 2014-08-29 DIAGNOSIS — E1010 Type 1 diabetes mellitus with ketoacidosis without coma: Secondary | ICD-10-CM | POA: Diagnosis not present

## 2014-08-29 NOTE — Progress Notes (Signed)
Diabetes Self-Management Education  Visit Type: First/Initial  Appt. Start Time: 0830 Appt. End Time: 1000  08/29/2014  Ms. Michelle Riley, identified by name and date of birth, is a 29 y.o. female with a diagnosis of Diabetes: Type 1.  Other people present during visit:  Patient   ASSESSMENT  Height 5\' 5"  (1.651 m), weight 148 lb 3.2 oz (67.223 kg). Body mass index is 24.66 kg/(m^2).  Initial Visit Information:  Are you currently following a meal plan?: No   Are you taking your medications as prescribed?: Yes Are you checking your feet?: No   How often do you need to have someone help you when you read instructions, pamphlets, or other written materials from your doctor or pharmacy?: 1 - Never What is the last grade level you completed in school?: Masters in Clinical Mental Health Counseling  Psychosocial:     Patient Belief/Attitude about Diabetes: Motivated to manage diabetes (Angry) Self-care barriers: None Self-management support: Doctor's office, Family, Friends Academic librarian) Other persons present: Patient Patient Concerns: Glycemic Control, Nutrition/Meal planning (weight management) Special Needs: None Preferred Learning Style: Auditory, Visual, Hands on Learning Readiness: Change in progress  Complications:   Last HgB A1C per patient/outside source: 13.6 mg/dL (newly diagnosed on Jul 27, 2014) How often do you check your blood sugar?: > 4 times/day (every meal, before she drives, with workout sessions) Fasting Blood glucose range (mg/dL): >096 Postprandial Blood glucose range (mg/dL): >438 Number of hypoglycemic episodes per month: 0 Number of hyperglycemic episodes per week:  (BG averaging in 200-300 range for now, gradually moving down to under 200 mg/dl) Have you had a dilated eye exam in the past 12 months?: Yes Have you had a dental exam in the past 12 months?: No  Diet Intake:  Breakfast: eggs, 2 organic whole grain toast OR cereal with almond milk,   Snack (morning): fresh fruit, string cheese Lunch: left overs with protein and carbs such as yogurt, vegetables, diet soda or water Snack (afternoon): protein bar especially if going to work out OR fresh fruit and PNB Dinner: small salad, lean protein, sweet potato, water or diet soda Snack (evening): varies depending on time of supper meal: protein and light carb - popcorn and cheese Beverage(s): water, diet soda  Exercise:  Exercise: Moderate (swimming / aerobic walking) (Zumba 4-5 days a week for an hour, my have boot camp in the future) Moderate Exercise amount of time (min / week): 150  Individualized Plan for Diabetes Self-Management Training:   Learning Objective:  Patient will have a greater understanding of diabetes self-management.  Patient education plan per assessed needs and concerns is to attend individual sessions for     Education Topics Reviewed with Patient Today:  Definition of diabetes, type 1 and 2, and the diagnosis of diabetes Role of diet in the treatment of diabetes and the relationship between the three main macronutrients and blood glucose level, Food label reading, portion sizes and measuring food., Carbohydrate counting, Reviewed blood glucose goals for pre and post meals and how to evaluate the patients' food intake on their blood glucose level. Role of exercise on diabetes management, blood pressure control and cardiac health., Identified with patient nutritional and/or medication changes necessary with exercise. Other (comment) (brief discussion of insulin pumps and CGM per her request) Identified appropriate SMBG and/or A1C goals. Taught treatment of hypoglycemia - the 15 rule.    (She acknowledges she is working through anger right now. Also shared history of eating disorder close to anorexia when she  was younger. Frustrated with lay peoples comments asuming DM 2 and poor health choices tied to DM 2., which she does not have.)   Helped patient develop  diabetes management plan for (enter comment)  PATIENTS GOALS/Plan (Developed by the patient):  Nutrition: Adjust meds/carbs with exercise as discussed, Follow meal plan discussed Physical Activity: Exercise 3-5 times per week Medications: take my medication as prescribed Monitoring : test blood glucose pre and post meals as discussed Reducing Risk: treat hypoglycemia with 15 grams of carbs if blood glucose less than /dL Health Coping: Other (comment) (Consider attending DM 1 Support Group)  Plan:   Patient Instructions  Plan:  Aim for 2 Carb Choices per meal (30 grams) +/- 1 either way  Aim for 0-1 Carbs per snack if hungry  Include protein in moderation with your meals and snacks Consider reading food labels for Total Carbohydrate of foods Continue with your activity level as tolerated Continue checking BG at alternate times per day as directed by MD  Continue taking medication insulin as directed by MD Consider attending DM 1 Support Group when you're ready      Expected Outcomes:  Demonstrated interest in learning. Expect positive outcomes  Education material provided: Living Well with Diabetes, A1C conversion sheet, Support group flyer and Carbohydrate counting sheet  If problems or questions, patient to contact team via:  Phone and Email  Future DSME appointment: 2 wks (I offered follow up with Danise Edge Reavis to address history of eating disorder to help prevent potential relapse)

## 2014-08-29 NOTE — Patient Instructions (Signed)
Plan:  Aim for 2 Carb Choices per meal (30 grams) +/- 1 either way  Aim for 0-1 Carbs per snack if hungry  Include protein in moderation with your meals and snacks Consider reading food labels for Total Carbohydrate of foods Continue with your activity level as tolerated Continue checking BG at alternate times per day as directed by MD  Continue taking medication insulin as directed by MD Consider attending DM 1 Support Group when you're ready

## 2014-09-13 ENCOUNTER — Ambulatory Visit: Payer: BLUE CROSS/BLUE SHIELD | Admitting: *Deleted

## 2014-09-19 ENCOUNTER — Ambulatory Visit: Payer: BLUE CROSS/BLUE SHIELD | Admitting: *Deleted

## 2014-10-08 ENCOUNTER — Telehealth: Payer: Self-pay | Admitting: Family Medicine

## 2014-10-08 NOTE — Telephone Encounter (Signed)
FYI

## 2014-10-08 NOTE — Telephone Encounter (Signed)
I will certainly accept her as a pt but I do not manage type I diabetes- all of my pt's have Endocrinology specialists

## 2014-10-08 NOTE — Telephone Encounter (Signed)
Please advise 

## 2014-10-08 NOTE — Telephone Encounter (Signed)
Caller name: Sharon KowalsAchille Richhip to patient: mother Can be reached: call pt, see below  Reason for call: Pt mother is a current pt of Dr. Beverely Low. Pt has been dx with type 1 diabeties and looking for a doctor to follow her. Had been seeing an NP. Please call pts cell # to notify her if approved for new pt with Dr. Beverely Low, [phone].

## 2014-10-09 NOTE — Telephone Encounter (Signed)
Notified pt, she does have endo. Appt sched for 01/07/15.

## 2014-10-10 ENCOUNTER — Encounter (HOSPITAL_COMMUNITY): Payer: Self-pay

## 2014-10-10 ENCOUNTER — Telehealth: Payer: Self-pay | Admitting: Nurse Practitioner

## 2014-10-10 ENCOUNTER — Emergency Department (HOSPITAL_COMMUNITY)
Admission: EM | Admit: 2014-10-10 | Discharge: 2014-10-10 | Disposition: A | Discharge status: Other Acute Inpatient Hospital | Payer: BLUE CROSS/BLUE SHIELD | Attending: Emergency Medicine | Admitting: Emergency Medicine

## 2014-10-10 ENCOUNTER — Inpatient Hospital Stay (HOSPITAL_COMMUNITY)
Admission: AD | Admit: 2014-10-10 | Discharge: 2014-10-12 | DRG: 885 | Disposition: A | Discharge status: Home / Self Care | Payer: BLUE CROSS/BLUE SHIELD | Source: Intra-hospital | Attending: Psychiatry | Admitting: Psychiatry

## 2014-10-10 ENCOUNTER — Encounter (HOSPITAL_COMMUNITY): Payer: Self-pay | Admitting: Emergency Medicine

## 2014-10-10 DIAGNOSIS — F411 Generalized anxiety disorder: Secondary | ICD-10-CM

## 2014-10-10 DIAGNOSIS — F419 Anxiety disorder, unspecified: Secondary | ICD-10-CM | POA: Insufficient documentation

## 2014-10-10 DIAGNOSIS — Z3202 Encounter for pregnancy test, result negative: Secondary | ICD-10-CM | POA: Insufficient documentation

## 2014-10-10 DIAGNOSIS — F410 Panic disorder [episodic paroxysmal anxiety] without agoraphobia: Secondary | ICD-10-CM | POA: Diagnosis present

## 2014-10-10 DIAGNOSIS — E119 Type 2 diabetes mellitus without complications: Secondary | ICD-10-CM | POA: Insufficient documentation

## 2014-10-10 DIAGNOSIS — Z794 Long term (current) use of insulin: Secondary | ICD-10-CM | POA: Diagnosis not present

## 2014-10-10 DIAGNOSIS — F1310 Sedative, hypnotic or anxiolytic abuse, uncomplicated: Secondary | ICD-10-CM | POA: Insufficient documentation

## 2014-10-10 DIAGNOSIS — F332 Major depressive disorder, recurrent severe without psychotic features: Principal | ICD-10-CM | POA: Diagnosis present

## 2014-10-10 DIAGNOSIS — E10649 Type 1 diabetes mellitus with hypoglycemia without coma: Secondary | ICD-10-CM | POA: Diagnosis present

## 2014-10-10 DIAGNOSIS — F329 Major depressive disorder, single episode, unspecified: Secondary | ICD-10-CM | POA: Insufficient documentation

## 2014-10-10 DIAGNOSIS — Z79899 Other long term (current) drug therapy: Secondary | ICD-10-CM

## 2014-10-10 DIAGNOSIS — R45851 Suicidal ideations: Secondary | ICD-10-CM

## 2014-10-10 DIAGNOSIS — Z862 Personal history of diseases of the blood and blood-forming organs and certain disorders involving the immune mechanism: Secondary | ICD-10-CM | POA: Diagnosis not present

## 2014-10-10 LAB — COMPREHENSIVE METABOLIC PANEL
ALBUMIN: 3.3 g/dL — AB (ref 3.5–5.0)
ALT: 18 U/L (ref 14–54)
ANION GAP: 11 (ref 5–15)
AST: 21 U/L (ref 15–41)
Alkaline Phosphatase: 55 U/L (ref 38–126)
BUN: 13 mg/dL (ref 6–20)
CALCIUM: 8.9 mg/dL (ref 8.9–10.3)
CO2: 27 mmol/L (ref 22–32)
CREATININE: 0.68 mg/dL (ref 0.44–1.00)
Chloride: 103 mmol/L (ref 101–111)
GFR calc Af Amer: >60 mL/min (ref >60)
GFR calc non Af Amer: >60 mL/min (ref >60)
Glucose, Bld: 78 mg/dL (ref 65–99)
Potassium: 3.5 mmol/L (ref 3.5–5.1)
Sodium: 141 mmol/L (ref 135–145)
TOTAL PROTEIN: 6.6 g/dL (ref 6.5–8.1)
Total Bilirubin: 0.2 mg/dL — ABNORMAL LOW (ref 0.3–1.2)

## 2014-10-10 LAB — RAPID URINE DRUG SCREEN, HOSP PERFORMED
Amphetamines: NOT DETECTED
BARBITURATES: POSITIVE — AB
Benzodiazepines: NOT DETECTED
Cocaine: NOT DETECTED
OPIATES: NOT DETECTED
Tetrahydrocannabinol: NOT DETECTED

## 2014-10-10 LAB — CBC
HCT: 39.9 % (ref 36.0–46.0)
HEMOGLOBIN: 13.4 g/dL (ref 12.0–15.0)
MCH: 32.8 pg (ref 26.0–34.0)
MCHC: 33.6 g/dL (ref 30.0–36.0)
MCV: 97.8 fL (ref 78.0–100.0)
Platelets: 191 10*3/uL (ref 150–400)
RBC: 4.08 MIL/uL (ref 3.87–5.11)
RDW: 11.6 % (ref 11.5–15.5)
WBC: 4.3 10*3/uL (ref 4.0–10.5)

## 2014-10-10 LAB — GLUCOSE, CAPILLARY: GLUCOSE-CAPILLARY: 131 mg/dL — AB (ref 65–99)

## 2014-10-10 LAB — ETHANOL: Alcohol, Ethyl (B): <5 mg/dL (ref <5)

## 2014-10-10 LAB — ACETAMINOPHEN LEVEL: Acetaminophen (Tylenol), Serum: <10 ug/mL — ABNORMAL LOW (ref 10–30)

## 2014-10-10 LAB — SALICYLATE LEVEL

## 2014-10-10 LAB — CBG MONITORING, ED: Glucose-Capillary: 78 mg/dL (ref 65–99)

## 2014-10-10 LAB — POC URINE PREG, ED: Preg Test, Ur: NEGATIVE

## 2014-10-10 MED ORDER — ATENOLOL 12.5 MG HALF TABLET
12.5000 mg | ORAL_TABLET | Freq: Every day | ORAL | Status: DC
  Administered 2014-10-11: 12.5 mg via ORAL
  Filled 2014-10-10 (×3): qty 1

## 2014-10-10 MED ORDER — SUMATRIPTAN SUCCINATE 50 MG PO TABS
50.0000 mg | ORAL_TABLET | ORAL | Status: DC | PRN
  Administered 2014-10-11 – 2014-10-12 (×5): 50 mg via ORAL
  Filled 2014-10-10 (×5): qty 1

## 2014-10-10 MED ORDER — INSULIN DETEMIR 100 UNIT/ML FLEXPEN: 10.0000 [IU] | PEN_INJECTOR | Freq: Every day | SUBCUTANEOUS | Status: DC

## 2014-10-10 MED ORDER — ALUM & MAG HYDROXIDE-SIMETH 200-200-20 MG/5ML PO SUSP: 30.0000 mL | ORAL | Status: DC | PRN

## 2014-10-10 MED ORDER — INSULIN ASPART 100 UNIT/ML ~~LOC~~ SOLN
0.0000 [IU] | Freq: Three times a day (TID) | SUBCUTANEOUS | Status: DC
  Administered 2014-10-11: 2 [IU] via SUBCUTANEOUS

## 2014-10-10 MED ORDER — LORAZEPAM 1 MG PO TABS: 1.0000 mg | ORAL_TABLET | Freq: Three times a day (TID) | ORAL | Status: DC | PRN

## 2014-10-10 MED ORDER — ACETAMINOPHEN 325 MG PO TABS: 650.0000 mg | ORAL_TABLET | Freq: Four times a day (QID) | ORAL | Status: DC | PRN

## 2014-10-10 MED ORDER — IBUPROFEN 400 MG PO TABS: 600.0000 mg | ORAL_TABLET | Freq: Three times a day (TID) | ORAL | Status: DC | PRN

## 2014-10-10 MED ORDER — ONDANSETRON HCL 4 MG PO TABS: 4.0000 mg | ORAL_TABLET | Freq: Three times a day (TID) | ORAL | Status: DC | PRN

## 2014-10-10 MED ORDER — ALUM & MAG HYDROXIDE-SIMETH 200-200-20 MG/5ML PO SUSP
30.0000 mL | ORAL | Status: DC | PRN
Start: 2014-10-10 — End: 2014-10-12

## 2014-10-10 MED ORDER — LORATADINE 10 MG PO TABS
10.0000 mg | ORAL_TABLET | Freq: Every day | ORAL | Status: DC
  Administered 2014-10-11 – 2014-10-12 (×2): 10 mg via ORAL
  Filled 2014-10-10 (×3): qty 1

## 2014-10-10 MED ORDER — BUTALBITAL-APAP-CAFFEINE 50-325-40 MG PO TABS
1.0000 | ORAL_TABLET | Freq: Two times a day (BID) | ORAL | Status: DC | PRN
  Administered 2014-10-10: 1 via ORAL
  Filled 2014-10-10: qty 1

## 2014-10-10 MED ORDER — NORGESTIMATE-ETH ESTRADIOL 0.25-35 MG-MCG PO TABS: 1.0000 | ORAL_TABLET | Freq: Every day | ORAL | Status: DC

## 2014-10-10 MED ORDER — TRAZODONE HCL 50 MG PO TABS
50.0000 mg | ORAL_TABLET | Freq: Every evening | ORAL | Status: DC | PRN
  Filled 2014-10-10 (×5): qty 1

## 2014-10-10 MED ORDER — INSULIN ASPART 100 UNIT/ML FLEXPEN: 0.0000 [IU] | PEN_INJECTOR | Freq: Three times a day (TID) | SUBCUTANEOUS | Status: DC

## 2014-10-10 MED ORDER — MAGNESIUM HYDROXIDE 400 MG/5ML PO SUSP: 30.0000 mL | Freq: Every day | ORAL | Status: DC | PRN

## 2014-10-10 MED ORDER — INSULIN DETEMIR 100 UNIT/ML ~~LOC~~ SOLN
25.0000 [IU] | Freq: Two times a day (BID) | SUBCUTANEOUS | Status: DC
  Administered 2014-10-11: 25 [IU] via SUBCUTANEOUS

## 2014-10-10 MED ORDER — NORGESTIMATE-ETH ESTRADIOL 0.25-35 MG-MCG PO TABS
1.0000 | ORAL_TABLET | Freq: Every day | ORAL | Status: DC
  Administered 2014-10-11: 1 via ORAL

## 2014-10-10 MED ORDER — INSULIN DETEMIR 100 UNIT/ML ~~LOC~~ SOLN
25.0000 [IU] | Freq: Once | SUBCUTANEOUS | Status: AC
  Administered 2014-10-10: 25 [IU] via SUBCUTANEOUS
  Filled 2014-10-10: qty 0.25

## 2014-10-10 MED ORDER — ACETAMINOPHEN 325 MG PO TABS: 650.0000 mg | ORAL_TABLET | ORAL | Status: DC | PRN

## 2014-10-10 MED ORDER — INSULIN ASPART 100 UNIT/ML ~~LOC~~ SOLN: 0.0000 [IU] | Freq: Every day | SUBCUTANEOUS | Status: DC

## 2014-10-10 MED ORDER — CLONAZEPAM 0.5 MG PO TABS
0.5000 mg | ORAL_TABLET | Freq: Two times a day (BID) | ORAL | Status: DC | PRN
  Administered 2014-10-10 – 2014-10-11 (×3): 0.5 mg via ORAL
  Filled 2014-10-10 (×3): qty 1

## 2014-10-10 MED ORDER — INSULIN DETEMIR 100 UNIT/ML FLEXPEN: 25.0000 [IU] | PEN_INJECTOR | Freq: Two times a day (BID) | SUBCUTANEOUS | Status: DC

## 2014-10-10 MED ORDER — NICOTINE 21 MG/24HR TD PT24: 21.0000 mg | MEDICATED_PATCH | Freq: Every day | TRANSDERMAL | Status: DC

## 2014-10-10 MED ORDER — ZOLPIDEM TARTRATE 5 MG PO TABS: 5.0000 mg | ORAL_TABLET | Freq: Every evening | ORAL | Status: DC | PRN

## 2014-10-10 NOTE — ED Notes (Signed)
Pt st's she has had anxiety for since being dx with diabetes.  St's has been having increased panic attacks.  Pt st's she was put on Paxil 5 days ago and now feels suicidal  St's she took her insulin last night without eating in hopes she would go into a diabetic coma.  Pt tearful, mother at bedside s

## 2014-10-10 NOTE — ED Notes (Signed)
CBG checked 73 RN Fields informed

## 2014-10-10 NOTE — ED Notes (Signed)
Tele-psych at bedside.

## 2014-10-10 NOTE — ED Notes (Signed)
Per Marlon Pel, PA, pt to use own novolog until her MAR is resolved with correct sliding scale so patient can eat.  Pt's FSGB is 110 currently.

## 2014-10-10 NOTE — ED Notes (Signed)
Charge nurse Morrison Old, RN made aware

## 2014-10-10 NOTE — ED Provider Notes (Signed)
CSN: [id number]     Arrival date & time 10/10/14  1548 History  This chart was scribed for non-physician practitioner Marlon Pel, PA-C working with Azalia Bilis, MD, by Jarvis Morgan, ED Scribe. This patient was seen in room TR02C/TR02C and the patient's care was started at 4:44 PM.    Chief Complaint  Patient presents with  . Suicidal     The history is provided by the patient and a parent. No language interpreter was used.    HPI Comments: Michelle Riley is a 29 y.o. female who presents to the Emergency Department complaining of suicidal thoughts, with plan, onset 5 days ago. She states she took her insulin last night without eating in hopes she would go into a diabetic coma. Pt reports she has always had generalized anxiety and panic attacks. She states her anxiety has been gradually worsening since she was diagnosed with Type I diabetes. Since her diagnosis she is now having depression. She was put on Paxil 5 days ago after Lexapro was not helping.  Pt would like to have a medication change. She denies any other medical concerns. She feels like she needs  Pt takes Imitrex for migraines and Atenolol to help prevent migraines. She denies any other complaints at this time. She denies any fever, chills, nausea, vomiting, hallucinations or HI.    Past Medical History  Diagnosis Date  . Diabetes mellitus without complication   . Chronic benign neutropenia    Past Surgical History  Procedure Laterality Date  . Tonsillectomy    . Knee surgery    . Tonsillectomy and adenoidectomy     Family History  Problem Relation Age of Onset  . Diabetes Mellitus II Maternal Grandmother   . Diabetes Mellitus II Paternal Grandfather   . Leukemia Paternal Grandfather    History  Substance Use Topics  . Smoking status: Never Smoker   . Smokeless tobacco: Never Used  . Alcohol Use: No   OB History    No data available     Review of Systems  Constitutional: Negative for fever and chills.   Gastrointestinal: Negative for nausea and vomiting.  Psychiatric/Behavioral: Positive for suicidal ideas and dysphoric mood. Negative for hallucinations. The patient is nervous/anxious.    Allergies  Review of patient's allergies indicates no known allergies.  Home Medications   Prior to Admission medications   Medication Sig Start Date End Date Taking? Authorizing Provider  ACCU-CHEK SOFTCLIX LANCETS lancets  07/27/14   Historical Provider, MD  aspirin-acetaminophen-caffeine (EXCEDRIN MIGRAINE) [phone] MG per tablet Take by mouth every 6 (six) hours as needed for migraine.    Historical Provider, MD  insulin aspart (NOVOLOG FLEXPEN) 100 UNIT/ML FlexPen Inject 0-9 Units into the skin 3 (three) times daily with meals. 07/30/14   Penny Pia, MD  Insulin Detemir (LEVEMIR) 100 UNIT/ML Pen Inject 10 Units into the skin daily at 12 noon. 07/30/14   Penny Pia, MD  MONONESSA 0.25-35 MG-MCG tablet Take 1 tablet by mouth at bedtime.  07/23/14   Historical Provider, MD   Triage Vitals: BP 120/81 mmHg  Pulse 57  Temp(Src) 97.2 F (36.2 C) (Oral)  Resp 18  SpO2 96%  LMP 10/10/2014  Physical Exam  Constitutional: She is oriented to person, place, and time. She appears well-developed and well-nourished. No distress.  HENT:  Head: Normocephalic and atraumatic.  Eyes: Conjunctivae and EOM are normal.  Neck: Neck supple. No tracheal deviation present.  Cardiovascular: Normal rate.   Pulmonary/Chest: Effort normal. No respiratory distress.  Musculoskeletal: Normal range of motion.  Neurological: She is alert and oriented to person, place, and time.  Skin: Skin is warm and dry.  Psychiatric: Her behavior is normal. Her mood appears anxious. She is not actively hallucinating. She exhibits a depressed mood. She expresses suicidal ideation. She expresses no homicidal ideation. She expresses suicidal plans. She expresses no homicidal plans.  Nursing note and vitals reviewed.  ED Course   Procedures (including critical care time)   COORDINATION OF CARE:    Labs Review Labs Reviewed  COMPREHENSIVE METABOLIC PANEL - Abnormal; Notable for the following:    Albumin 3.3 (*)    Total Bilirubin 0.2 (*)    All other components within normal limits  ACETAMINOPHEN LEVEL - Abnormal; Notable for the following:    Acetaminophen (Tylenol), Serum <10 (*)    All other components within normal limits  URINE RAPID DRUG SCREEN, HOSP PERFORMED - Abnormal; Notable for the following:    Barbiturates POSITIVE (*)    All other components within normal limits  ETHANOL  SALICYLATE LEVEL  CBC  CBG MONITORING, ED  POC URINE PREG, ED    Imaging Review No results found.   EKG Interpretation None      MDM   Final diagnoses:  Generalized anxiety disorder  Suicidal ideation    Patient medically cleared at this time. Telepsych and psych doc have determined that she meets criteria for inpatient treatment.  Holding orders placed Diet ordered Labs reviewed Home meds reviewed and ordered accordingly.  Filed Vitals:   10/10/14 1639  BP: 120/81  Pulse: 57  Temp: 97.2 F (36.2 C)  Resp: 18    I personally performed the services described in this documentation, which was scribed in my presence. The recorded information has been reviewed and is accurate.   Marlon Pel, PA-C 10/10/14 1855  Azalia Bilis, MD 10/10/14 2139

## 2014-10-10 NOTE — ED Notes (Signed)
Pt's mom taking all belongings home with her.

## 2014-10-10 NOTE — Telephone Encounter (Signed)
I cannot prescribe medications for any pt until they have been seen in this office.  She can schedule an acute visit prior to her new pt appt but no meds can be given until pt evaluated

## 2014-10-10 NOTE — Telephone Encounter (Signed)
Relation to pt: self  Call back number: [phone]   Reason for call:  patient is currently on Paxil medication and would like to try another med and patient stated "does not trust current PCP to prescribe". Patient has a new patient appointment scheduled for 01/07/2015 with Dr. Beverely Low  . Please advise

## 2014-10-10 NOTE — Tx Team (Signed)
Initial Interdisciplinary Treatment Plan   PATIENT STRESSORS: Health problems Medication change or noncompliance Traumatic event   PATIENT STRENGTHS: Ability for insight Average or above average intelligence Capable of independent living Communication skills Motivation for treatment/growth Physical Health Supportive family/friends Work skills   PROBLEM LIST: Problem List/Patient Goals Date to be addressed Date deferred Reason deferred Estimated date of resolution  "I have a lot of anxiety because I was dx with diabetes"  10/10/14     "I am depressed b/c I was DX with diabetes"                                                  DISCHARGE CRITERIA:  Ability to meet basic life and health needs Improved stabilization in mood, thinking, and/or behavior Reduction of life-threatening or endangering symptoms to within safe limits  PRELIMINARY DISCHARGE PLAN: Attend aftercare/continuing care group Outpatient therapy Return to previous living arrangement  PATIENT/FAMIILY INVOLVEMENT: This treatment plan has been presented to and reviewed with the patient, Michelle Riley.  The patient and family have been given the opportunity to ask questions and make suggestions.  [name] 10/10/2014, 11:27 PM

## 2014-10-10 NOTE — ED Notes (Signed)
Pt placed in wine colored scrubs.  Security called to wand pt

## 2014-10-10 NOTE — ED Notes (Signed)
[facility] called for transport. 
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
## 2014-10-10 NOTE — ED Notes (Signed)
Pt discharged to [facility] with [facility] for transport.  Mom took pt's belongings home.

## 2014-10-10 NOTE — BH Assessment (Signed)
[facility] Assessment Progress Note  Patient has been accepted to [facility] 405-2, accepting doctor Cobos. She can come after 2100. Counselor advised pt's nurse, [place], of this information and [place] agreed to have pt sign the Consent for Voluntary Admission form.  Johny Shock. Ladona Ridgel, MS, NCC Disposition Counselor

## 2014-10-10 NOTE — ED Notes (Signed)
Staffing office called for need of sitter

## 2014-10-10 NOTE — Telephone Encounter (Signed)
Called patient to schedule an acute appointment.  Patient states that her current medication, Paxil, is making her want to kill herself.  Notified patient to go to ED to get help sooner and she stated that she would call her mom and go.  She wanted to schedule an appointment for August to discuss medications with MD- states she does not trust her old PCP and would like new doctor to discuss.  Scheduled appointment, but notified patient to go to ED.  Patient stated understanding and agreed.

## 2014-10-10 NOTE — Telephone Encounter (Signed)
Please advise 

## 2014-10-10 NOTE — ED Notes (Signed)
Supper tray ordered for pt.  Report given to Jani Files, RN

## 2014-10-11 DIAGNOSIS — F410 Panic disorder [episodic paroxysmal anxiety] without agoraphobia: Secondary | ICD-10-CM | POA: Diagnosis present

## 2014-10-11 DIAGNOSIS — F332 Major depressive disorder, recurrent severe without psychotic features: Principal | ICD-10-CM

## 2014-10-11 DIAGNOSIS — F411 Generalized anxiety disorder: Secondary | ICD-10-CM | POA: Diagnosis present

## 2014-10-11 LAB — GLUCOSE, CAPILLARY
GLUCOSE-CAPILLARY: 171 mg/dL — AB (ref 65–99)
GLUCOSE-CAPILLARY: 217 mg/dL — AB (ref 65–99)
Glucose-Capillary: 62 mg/dL — ABNORMAL LOW (ref 65–99)
Glucose-Capillary: 127 mg/dL — ABNORMAL HIGH (ref 65–99)
Glucose-Capillary: 196 mg/dL — ABNORMAL HIGH (ref 65–99)

## 2014-10-11 MED ORDER — INSULIN ASPART 100 UNIT/ML ~~LOC~~ SOLN
0.0000 [IU] | Freq: Every day | SUBCUTANEOUS | Status: DC
  Administered 2014-10-11: 2 [IU] via SUBCUTANEOUS

## 2014-10-11 MED ORDER — INSULIN ASPART 100 UNIT/ML ~~LOC~~ SOLN
0.0000 [IU] | Freq: Three times a day (TID) | SUBCUTANEOUS | Status: DC
  Administered 2014-10-11: 2 [IU] via SUBCUTANEOUS

## 2014-10-11 MED ORDER — ESCITALOPRAM OXALATE 20 MG PO TABS
20.0000 mg | ORAL_TABLET | Freq: Every day | ORAL | Status: DC
  Administered 2014-10-11: 20 mg via ORAL
  Filled 2014-10-11 (×2): qty 1

## 2014-10-11 MED ORDER — BUSPIRONE HCL 15 MG PO TABS
7.5000 mg | ORAL_TABLET | Freq: Two times a day (BID) | ORAL | Status: DC
  Administered 2014-10-11 – 2014-10-12 (×2): 7.5 mg via ORAL
  Filled 2014-10-11 (×4): qty 1

## 2014-10-11 MED ORDER — INSULIN ASPART 100 UNIT/ML ~~LOC~~ SOLN
1.0000 [IU] | Freq: Three times a day (TID) | SUBCUTANEOUS | Status: DC
  Administered 2014-10-11 – 2014-10-12 (×2): 3 [IU] via SUBCUTANEOUS

## 2014-10-11 MED ORDER — INSULIN DETEMIR 100 UNIT/ML ~~LOC~~ SOLN
23.0000 [IU] | Freq: Two times a day (BID) | SUBCUTANEOUS | Status: DC
  Administered 2014-10-11 – 2014-10-12 (×2): 23 [IU] via SUBCUTANEOUS

## 2014-10-11 MED ORDER — ATENOLOL 25 MG PO TABS
25.0000 mg | ORAL_TABLET | Freq: Every day | ORAL | Status: DC
  Administered 2014-10-12: 25 mg via ORAL
  Filled 2014-10-11 (×2): qty 1
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
## 2014-10-11 NOTE — Progress Notes (Signed)
Patient's blood sugar before breakfast was 62.  Hypoglycemic protocol was not followed.  Signed 0700 insulin dose as not given as patient had already eaten breakfast.  Patient continues to have anxiety over her diagnosis.  She states she does carb counting at home and bases her insulin administration by the foods she eats.  Diabetic consult was placed and patient will meet with coordinator around 1500.  Diabetic nurse will review orders and make recommendations.
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
## 2014-10-11 NOTE — Progress Notes (Signed)
Attended Karaoke group tonight, and participated.  

## 2014-10-11 NOTE — H&P (Signed)
Psychiatric Admission Assessment Adult  Patient Identification: Michelle Riley MRN:  [id number] Date of Evaluation:  10/11/2014 Chief Complaint:  MDD Principal Diagnosis: MDD (major depressive disorder), recurrent episode, severe Diagnosis:   Patient Active Problem List   Diagnosis Date Noted  . MDD (major depressive disorder), recurrent episode, severe [F33.2] 10/10/2014  . Diabetic ketoacidosis without coma associated with type 1 diabetes mellitus [E10.10]   . SOB (shortness of breath) [R06.02]   . DKA (diabetic ketoacidoses) [E13.10] 07/27/2014   History of Present Illness: Michelle Riley is a pleasant 29 y.o. female who came initially on her own volition to [facility].  She states that her main problem and laways has been, is her anxiety disorder and panic attacks.  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  She reports back in May 2016 she was diagnosed with Type 1 DM.  She then became more depressed and went back to her PCP to be placed on an antidepressant.  She was initially on Lexapro 2 years ago.  "Instead of the PCP increasing her Lexapro to 20 (she had been on 10 mg), she switched me to Paxil.  My mood became worse and I started to have SI.  I did not ever before that.  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  She described being too caught up in the children's lives and she internalized many things.  She broke down after that. Pt discloses after taking Paxil, she took her insulin that night w/out eating hoping to fall into a diabetic coma and die. Pt expresses relief that her suicide attempt was unsuccessful, but still endorses SI with no plan. Pt denies HI or AVH. Pt strongly believes that she needs a change in medication and is not interested in continuing to get psychotropic meds from her primary care doctor.   Elements:   Location:  depression.  suicide ideation. Quality:  anxious, helpless.  hopeless. Timing:  since being diagnosed with DM1. Context:  see HPI. Associated Signs/Symptoms: Depression Symptoms:  depressed mood, anxiety, panic attacks, (Hypo) Manic Symptoms:  Labiality of Mood, Anxiety Symptoms:  Excessive Worry, Panic Symptoms, Psychotic Symptoms:  NA PTSD Symptoms: NA Total Time spent with patient: 45 minutes  Past Medical History:  Past Medical History  Diagnosis Date  . Diabetes mellitus without complication   . Chronic benign neutropenia     Past Surgical History  Procedure Laterality Date  . Tonsillectomy    . Knee surgery    . Tonsillectomy and adenoidectomy     Family History:  Family History  Problem Relation Age of Onset  . Diabetes Mellitus II Maternal Grandmother   . Diabetes Mellitus II Paternal Grandfather   . Leukemia Paternal Grandfather    Social History:  History  Alcohol Use No     History  Drug Use No    History   Social History  . Marital Status: Single    Spouse Name: N/A  . Number of Children: N/A  . Years of Education: N/A   Social History Main Topics  . Smoking status: Never Smoker   . Smokeless tobacco: Never Used  . Alcohol Use: No  . Drug Use: No  . Sexual Activity: Yes    Birth Control/ Protection: Pill   Other Topics Concern  . None   Social History Narrative   Additional Social History:  History of alcohol / drug use?: No history of alcohol / drug abuse  Musculoskeletal: Strength & Muscle Tone: within normal limits Gait & Station: normal Patient leans: N/A  Psychiatric Specialty Exam: Physical Exam  Vitals reviewed. Psychiatric: Her mood appears anxious. Thought content is paranoid. Thought content is not delusional. She exhibits a depressed mood. She expresses no homicidal and no suicidal ideation.    Review of Systems  Constitutional: Negative for fever.  Eyes: Negative for blurred vision.  Cardiovascular:  Negative for chest pain.  Gastrointestinal: Negative for heartburn.  Genitourinary: Negative for dysuria.    Blood pressure 101/67, pulse 87, temperature 97.2 F (36.2 C), temperature source Oral, resp. rate 18, height 5\' 5"  (1.651 m), weight 71.668 kg (158 lb), last menstrual period 10/10/2014, SpO2 99 %.Body mass index is 26.29 kg/(m^2).  General Appearance: Neat  Eye Contact::  Good  Speech:  Clear and Coherent  Volume:  Normal  Mood:  Anxious  Affect:  Congruent and Depressed  Thought Process:  Goal Directed  Orientation:  Full (Time, Place, and Person)  Thought Content:  Rumination  Suicidal Thoughts:  No  Homicidal Thoughts:  No  Memory:  Immediate;   Fair Recent;   Fair Remote;   Fair  Judgement:  Fair  Insight:  Fair  Psychomotor Activity:  Normal  Concentration:  Fair  Recall:  Good  Fund of Knowledge:Good  Language: Good  Akathisia:  Negative  Handed:  Right  AIMS (if indicated):     Assets:  Desire for Improvement Financial Resources/Insurance Housing Intimacy Physical Health Resilience Social Support Talents/Skills Vocational/Educational  ADL's:  Intact  Cognition: WNL  Sleep:  Number of Hours: 6.5   Risk to Self: Is patient at risk for suicide?: No What has been your use of drugs/alcohol within the last 12 months?:  (denies) Risk to Others:   Prior Inpatient Therapy:   Prior Outpatient Therapy:    Alcohol Screening: 1. How often do you have a drink containing alcohol?: Never 9. Have you or someone else been injured as a result of your drinking?: No 10. Has a relative or friend or a doctor or another health worker been concerned about your drinking or suggested you cut down?: No Alcohol Use Disorder Identification Test Final Score (AUDIT): 0 Brief Intervention:  (N/A)  Allergies:   Allergies  Allergen Reactions  . Paxil [Paroxetine] Other (See Comments)    Suicidal tendencies (abrupt change from lexapro to paxil 10/10/14)    Lab Results:   Results for orders placed or performed during the hospital encounter of 10/10/14 (from the past 48 hour(s))  Glucose, capillary     Status: Abnormal   Collection Time: 10/10/14 10:53 PM  Result Value Ref Range   Glucose-Capillary 131 (H) 65 - 99 mg/dL  Glucose, capillary     Status: Abnormal   Collection Time: 10/11/14  6:12 AM  Result Value Ref Range   Glucose-Capillary 62 (L) 65 - 99 mg/dL  Glucose, capillary     Status: Abnormal   Collection Time: 10/11/14  7:21 AM  Result Value Ref Range   Glucose-Capillary 127 (H) 65 - 99 mg/dL   Current Medications: Current Facility-Administered Medications  Medication Dose Route Frequency Provider Last Rate Last Dose  . acetaminophen (TYLENOL) tablet 650 mg  650 mg Oral Q6H PRN Kerry Hough, PA-C      . alum & mag hydroxide-simeth (MAALOX/MYLANTA) 200-200-20 MG/5ML suspension 30 mL  30 mL Oral Q4H PRN Kerry Hough, PA-C      .  atenolol (TENORMIN) tablet 12.5 mg  12.5 mg Oral Daily Kerry Hough, PA-C   12.5 mg at 10/11/14 0804  . butalbital-acetaminophen-caffeine (FIORICET, ESGIC) 50-325-40 MG per tablet 1 tablet  1 tablet Oral BID PRN Kerry Hough, PA-C   1 tablet at 10/10/14 2249  . clonazePAM (KLONOPIN) tablet 0.5 mg  0.5 mg Oral BID PRN Kerry Hough, PA-C   0.5 mg at 10/11/14 0981  . insulin aspart (novoLOG) injection 0-5 Units  0-5 Units Subcutaneous QHS Kerry Hough, PA-C   Stopped at 10/10/14 2358  . insulin aspart (novoLOG) injection 0-9 Units  0-9 Units Subcutaneous TID WC Kerry Hough, PA-C   2 Units at 10/11/14 1210  . insulin detemir (LEVEMIR) injection 25 Units  25 Units Subcutaneous BID Kerry Hough, PA-C   25 Units at 10/11/14 [phone]  . loratadine (CLARITIN) tablet 10 mg  10 mg Oral Daily Kerry Hough, PA-C   10 mg at 10/11/14 7829  . magnesium hydroxide (MILK OF MAGNESIA) suspension 30 mL  30 mL Oral Daily PRN Kerry Hough, PA-C      . norgestimate-ethinyl estradiol (ORTHO-CYCLEN,SPRINTEC,PREVIFEM)  0.25-35 MG-MCG tablet 1 tablet  1 tablet Oral QHS Kerry Hough, PA-C   Stopped at 10/10/14 2357  . SUMAtriptan (IMITREX) tablet 50 mg  50 mg Oral Q2H PRN Kerry Hough, PA-C   50 mg at 10/11/14 1209  . traZODone (DESYREL) tablet 50 mg  50 mg Oral QHS,MR X 1 Spencer E Simon, PA-C   50 mg at 10/11/14 0001   PTA Medications: Prescriptions prior to admission  Medication Sig Dispense Refill Last Dose  . atenolol (TENORMIN) 25 MG tablet Take 25 mg by mouth daily.   10/10/2014 at Unknown time  . butalbital-acetaminophen-caffeine (FIORICET, ESGIC) 50-325-40 MG per tablet Take 1 tablet by mouth 2 (two) times daily as needed for headache.   10/09/2014 at Unknown time  . cetirizine (ZYRTEC) 10 MG tablet Take 10 mg by mouth as needed for allergies.   Past Week at Unknown time  . clonazePAM (KLONOPIN) 0.5 MG tablet Take 0.5 mg by mouth 2 (two) times daily as needed for anxiety.   10/10/2014 at am  . escitalopram (LEXAPRO) 10 MG tablet Take 10 mg by mouth daily.  1 10/10/2014 at Unknown time  . GLUCAGEN HYPOKIT 1 MG SOLR injection Inject 1 application into the skin once. FOR HYPOGLYCEMIA  11 NOT USED  . insulin aspart (NOVOLOG FLEXPEN) 100 UNIT/ML FlexPen Inject 0-9 Units into the skin 3 (three) times daily with meals. (Patient taking differently: Inject 0-9 Units into the skin 3 (three) times daily with meals. 1 unit per every 50 points over 100 Plus 1 unit for every 10 carbs) 15 mL 0 10/10/2014 at Unknown time  . Insulin Detemir (LEVEMIR) 100 UNIT/ML Pen Inject 10 Units into the skin daily at 12 noon. (Patient taking differently: Inject 25 Units into the skin 2 (two) times daily. ) 15 mL 0 10/10/2014 at am  . MONONESSA 0.25-35 MG-MCG tablet Take 1 tablet by mouth at bedtime.   1 10/09/2014 at Unknown time  . phenylephrine (SUDAFED PE) 10 MG TABS tablet Take 10 mg by mouth every 4 (four) hours as needed (for sinus congestion).    10/09/2014 at Unknown time  . SUMAtriptan (IMITREX) 100 MG tablet Take 50 mg by mouth  every 2 (two) hours as needed for migraine. May repeat in 2 hours if headache persists or recurs.   10/10/2014 at Unknown time  Previous Psychotropic Medications: Yes   Substance Abuse History in the last 12 months:  No.    Consequences of Substance Abuse: NA  Results for orders placed or performed during the hospital encounter of 10/10/14 (from the past 72 hour(s))  Glucose, capillary     Status: Abnormal   Collection Time: 10/10/14 10:53 PM  Result Value Ref Range   Glucose-Capillary 131 (H) 65 - 99 mg/dL  Glucose, capillary     Status: Abnormal   Collection Time: 10/11/14  6:12 AM  Result Value Ref Range   Glucose-Capillary 62 (L) 65 - 99 mg/dL  Glucose, capillary     Status: Abnormal   Collection Time: 10/11/14  7:21 AM  Result Value Ref Range   Glucose-Capillary 127 (H) 65 - 99 mg/dL    Observation Level/Precautions:  15 minute checks  Laboratory:  per ED  Psychotherapy:  group  Medications:  As per medlist  Consultations:  As needed  Discharge Concerns:  Safety  Estimated LOS:  5-7 days  Other:     Psychological Evaluations: Yes   Treatment Plan/Recommendations:  Admit for crisis management and mood stabilization. Medication management to re-stabilize current mood symptoms.  Start Lexapro 20 mg QHS  Depresseion.  Buspar 7.5 mg BID anxiety control Group counseling sessions for coping skills Medical consults as needed.  Diabetic consult.  Orders per recommended by Diab. Coordinator. Review and reinstate any pertinent home medications for other health problems   Medical Decision Making:  Discuss test with performing physician (1), Decision to obtain old records (1), Review and summation of old records (2), Independent Review of image, tracing or specimen (2) and Review of Medication Regimen & Side Effects (2)  I certify that inpatient services furnished can reasonably be expected to improve the patient's condition.   Velna Hatchet May Agustin AGNP-BC 7/28/201612:21 PM I  personally assessed the patient, reviewed the physical exam and labs and formulated the treatment plan Madie Reno A. Dub Mikes, M.D.

## 2014-10-11 NOTE — Tx Team (Addendum)
Interdisciplinary Treatment Plan Update (Adult) Date: 10/11/2014   Date: 10/11/2014 2:10 PM  Progress in Treatment:  Attending groups: Yes  Participating in groups: Yes  Taking medication as prescribed: Yes  Tolerating medication: Yes  Family/Significant othe contact made: No, CSW assessing for appropriate contact Patient understands diagnosis: Yes Discussing patient identified problems/goals with staff: Yes  Medical problems stabilized or resolved: Yes  Denies suicidal/homicidal ideation: Yes Patient has not harmed self or Others: Yes   New problem(s) identified: None identified at this time.   Discharge Plan or Barriers: Pt will return home and follow-up with outpatient providers.  Additional comments: n/a   Reason for Continuation of Hospitalization:  Depression Medication stabilization Suicidal ideation  Estimated length of stay: 3-5 days  Review of initial/current patient goals per problem list:   1.  Goal(s): Patient will participate in aftercare plan  Met:  Yes  Target date:  As evidenced by: Patient will participate within aftercare plan AEB aftercare provider and housing plan at discharge being identified.   7/28: Pt will return home and follow-up with established providers   2.  Goal (s): Patient will exhibit decreased depressive symptoms and suicidal ideations.  Met:  Yes  Target date: 10/14/14  As evidenced by: Patient will utilize self rating of depression at 3 or below and demonstrate decreased signs of depression or be deemed stable for discharge by MD. 10/11/2014: Pt was admitted with symptoms of depression, rating 10/10. Pt continues to present with flat affect and depressive symptoms.  Pt will demonstrate decreased symptoms of depression and rate depression at 3/10 or lower prior to discharge. 10/12/2014-  Patient rates depression at 0/10.     Attendees:  Patient:    Family:    Physician: Dr. Sabra Heck, MD  10/11/2014 1:42 PM  Nursing: Lars Pinks,  RN Case manager  10/11/2014 1:42 PM  Clinical Social Worker Norman Clay, MSW 10/11/2014 1:42 PM  Other: Lucinda Dell, Beverly Sessions Liasion 10/11/2014 1:42 PM  Clinical:  Mayra Neer, RN; Jinny Sanders, RN 10/11/2014 1:42 PM  Other: , RN Charge Nurse 10/11/2014 1:42 PM  Other:      Peri Maris, [place] MSW

## 2014-10-11 NOTE — [facility] Group Notes (Signed)
[facility] [employer] Group Therapy 10/11/2014 1:15pm  Type of Therapy: [employer] Presentation  Participation Level: Active  Participation Quality: Attentive  Affect: Appropriate  Cognitive: Oriented  Insight: Developing/Improving  Engagement in Therapy: Engaged  Modes of Intervention: Discussion, Education and Socialization  Summary of Progress/Problems: Mental [employer] Association (MHA) Speaker came to talk about his personal journey with substance abuse and addiction. The pt processed ways by which to relate to the speaker. MHA speaker provided handouts and educational information pertaining to groups and services offered by the MHA. Pt was engaged in speaker's presentation and was receptive to resources provided.    Mohannad Olivero Carter, LCSWA 10/11/2014 1:39 PM  

## 2014-10-11 NOTE — [facility] Group Notes (Signed)
[facility] Group Notes:  (Nursing/MHT/Case Management/Adjunct)  Date:  10/11/2014  Time: 0900 am  Type of Therapy:  Psychoeducational Skills  Participation Level:  Active  Participation Quality:  Appropriate and Attentive  Affect:  Appropriate  Cognitive:  Alert and Appropriate  Insight:  Improving  Engagement in Group:  Limited  Modes of Intervention:  Support  Summary of Progress/Problems: Patient remains concerned about her diabetic sliding scale.  Advised patient would call the diabetic coordinator today.  Cranford Mon 10/11/2014, 9:37 AM

## 2014-10-11 NOTE — Progress Notes (Signed)
Patient had headache 5 out of 10 for pain. Imitrex given at 0625.  Patient blood sugar at 0600 was 62, patient requested regular soda, Ginger Ale given.

## 2014-10-11 NOTE — Progress Notes (Addendum)
Inpatient Diabetes Program Recommendations  AACE/ADA: New Consensus Statement on Inpatient Glycemic Control (2013)  Target Ranges:  Prepandial:   less than 140 mg/dL      Peak postprandial:   less than 180 mg/dL (1-2 hours)      Critically ill patients:  140 - 180 mg/dL   Results for LATEEFAH, MALLERY (MRN [id number]) as of 10/11/2014 13:00  Ref. Range 10/11/2014 06:12 10/11/2014 07:21  Glucose-Capillary Latest Ref Range: 65-99 mg/dL 62 (L) 409 (H)    Admit with: Suicidal Ideation, Depression  History: DM Type 1- Diagnosed May 2016  Home DM Meds: Levemir 25 units bid         Novolog 1 unit for every 50 mg/dl above target CBG of 811 mg/dl        Novolog 1 unit for every 10 grams of carbohydrates consumed  Current DM Orders: Levemir 25 units bid              Novolog Sensitive SSI (0-9 units) TID AC + HS    -Note patient was diagnosed in May 2016 with Type 1 DM.  -Saw Bev Paddock, Certified Diabetes Educator with [facility] on 08/29/14 and was provided with 1:1 DM self-care training at that session.  -Spoke with patient today in person at [facility] hospital.  Patient told me she is having anxiety over her CBGs and insulin dosing.  Patient currently only has orders for Novolog Sensitive SSI (0-9 units) TID AC + HS and Levemir 25 units bid (both were started last PM).  Patient was Hypoglycemic this AM (CBG 62 mg/dl).  Patient told me she normally gives Novolog with meals as well (1 unit Novolog for every 10 grams of carbohydrates consumed).    -Patient told me she sees Dr. Talmage Nap with [facility].  Per patient, Dr. Talmage Nap told her to take her Novolog before meals, however, patient told me she sometimes does not know how much she will eat (will get full before she is done eating the food on her plate) and then will have to keep eating the food (even though she is already full) b/c she took enough insulin to cover the entire plate of  food.  Suggested to patient that she wait up to maximum of 15 minutes after she starts eating to take her Novolog.  Explained to patient that Novolog can be taken 15 minutes before meals, at the start of a meal, or even 15 minutes into a meal.  Explained to patient that she should not wait longer than 15 minutes after she starts eating to take her Novolog meal coverage.  -Patient also told me she sometimes has low CBGs in the AM.  Suggested to patient that we decrease her Levemir dose slightly to 23 units bid.  Patient agreeable.  -Reviewed basic sick day rules with patient and reminded patient to check her CBGs more often when she is sick.  Explained to patient that she can take her Novolog correction scale (1 unit for every 50 mg/dl above target CBG) every 4 hours at home when she is sick.  Encouraged patient to drink plenty of non-caloric fluids when ill and CBGs are high.  Also explained to patient that she can add fluids with calories and can cover those fluids once CBGs drop below 200 mg/dl when she is sick.  -Patient did not have any further questions for me today.  Patient very appreciative of my visit.  Told patient I would ask the  RN caring for her to start a CBG log for her so she (patient) can keep up with her CBGs in the hospital.  Also told patient I would speak with the NP managing her care to get Novolog meal coverage ordered for as well.  Will ask for a carbohydrate count order (1 unit for every 10 grams of carbohydrates) and have patient tell RN how much insulin she needs with her meals.  -Spoke with May Velna Hatchet) Dominga Ferry, NP caring for patient.  NP agreeable with Levemir downward adjustment and addition of Novolog meal coverage.  Orders placed and reviewed with RN Rayfield Citizen.    Will follow Ambrose Finland RN, MSN, CDE Diabetes Coordinator Inpatient Glycemic Control Team Team Pager: [phone] (8a-5p)

## 2014-10-11 NOTE — Progress Notes (Signed)
D: Patient anxious today.  Her CBG at 0600 was 62.  Patient is concerned that she is on a regular sliding scale and she counts carbs at home.  She denies SI/HI/AVH.  Patient rates her depression as a 2; hopelessness as a 1; anxiety as a 7.  Her main concern is her recent diagnosis of diabetes type I.  Patient will meet with diabetic nurse at 1500 today. A: Continue to monitor medication management and MD orders.  Safety checks completed every 15 minutes per protocol.  Offer support and encouragement as needed. R: Patient is interacting well with staff and peers.
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
## 2014-10-12 DIAGNOSIS — F332 Major depressive disorder, recurrent severe without psychotic features: Secondary | ICD-10-CM | POA: Insufficient documentation

## 2014-10-12 LAB — GLUCOSE, CAPILLARY
GLUCOSE-CAPILLARY: 91 mg/dL (ref 65–99)
Glucose-Capillary: 98 mg/dL (ref 65–99)
Glucose-Capillary: 80 mg/dL (ref 65–99)

## 2014-10-12 MED ORDER — ATENOLOL 25 MG PO TABS: 25.0000 mg | ORAL_TABLET | Freq: Every day | ORAL | Status: DC

## 2014-10-12 MED ORDER — ESCITALOPRAM OXALATE 20 MG PO TABS: 20.0000 mg | ORAL_TABLET | Freq: Every day | ORAL | Status: DC

## 2014-10-12 MED ORDER — INSULIN ASPART 100 UNIT/ML FLEXPEN
0.0000 [IU] | PEN_INJECTOR | Freq: Three times a day (TID) | SUBCUTANEOUS | Status: DC
Start: 2014-10-12 — End: 2016-03-23

## 2014-10-12 MED ORDER — CLONAZEPAM 0.5 MG PO TABS: 0.5000 mg | ORAL_TABLET | Freq: Two times a day (BID) | ORAL | Status: DC | PRN

## 2014-10-12 MED ORDER — BUSPIRONE HCL 7.5 MG PO TABS: 7.5000 mg | ORAL_TABLET | Freq: Two times a day (BID) | ORAL | Status: DC

## 2014-10-12 MED ORDER — MONONESSA 0.25-35 MG-MCG PO TABS: 1.0000 | ORAL_TABLET | Freq: Every day | ORAL | Status: DC

## 2014-10-12 MED ORDER — INSULIN DETEMIR 100 UNIT/ML ~~LOC~~ SOLN: 23.0000 [IU] | Freq: Two times a day (BID) | SUBCUTANEOUS | Status: DC

## 2014-10-12 NOTE — BHH Suicide Risk Assessment (Signed)
[facility] Discharge Suicide Risk Assessment   Demographic Factors:  Adolescent or young adult and Caucasian  Total Time spent with patient: 30 minutes  Musculoskeletal: Strength & Muscle Tone: within normal limits Gait & Station: normal Patient leans: normal  Psychiatric Specialty Exam: Physical Exam  Review of Systems  Constitutional: Negative.   HENT: Negative.   Eyes: Negative.   Respiratory: Negative.   Cardiovascular: Negative.   Gastrointestinal: Negative.   Genitourinary: Negative.   Musculoskeletal: Negative.   Skin: Negative.   Neurological: Negative.   Endo/Heme/Allergies: Negative.   Psychiatric/Behavioral: Negative.     Blood pressure 110/57, pulse 76, temperature 98.3 F (36.8 C), temperature source Oral, resp. rate 16, height  (1.651 m), weight 71.668 kg (158 lb), last menstrual period 10/10/2014, SpO2 99 %.Body mass index is 26.29 kg/(m^2).  General Appearance: Fairly Groomed  Patent attorney::  Fair  Speech:  Clear and Coherent409  Volume:  Normal  Mood:  Euthymic  Affect:  Appropriate  Thought Process:  Coherent and Goal Directed  Orientation:  Full (Time, Place, and Person)  Thought Content:  plans as she moves on  Suicidal Thoughts:  No  Homicidal Thoughts:  No  Memory:  Immediate;   Fair Recent;   Fair Remote;   Fair  Judgement:  Fair  Insight:  Present  Psychomotor Activity:  Normal  Concentration:  Fair  Recall:  [employer] of Knowledge:Fair  Language: Fair  Akathisia:  No  Handed:  Right  AIMS (if indicated):     Assets:  Desire for Improvement Housing Social Support Vocational/Educational  Sleep:  Number of Hours: 5.25  Cognition: WNL  ADL's:  Intact   Have you used any form of tobacco in the last 30 days? (Cigarettes, Smokeless Tobacco, Cigars, and/or Pipes): No  Has this patient used any form of tobacco in the last 30 days? (Cigarettes, Smokeless Tobacco, Cigars, and/or Pipes) No  Mental Status Per Nursing Assessment::   On  Admission:  Suicidal ideation indicated by patient  Current Mental Status by Physician: IN full contact with reality. Denies SI plans or intent. Her mood is euthymic. Her affect is appropriate. The reaction to the Paxil has resolved. She states she is back to feeling herself. She is going to pursue the Lexapro and continue the Buspar. She is going to continue the Klonopin at the 0.5 mg BID PRN. She is going to have a medication management follow up in the next two weeks. She will discuss response to this medication regime and follow their recommendations   Loss Factors: Decline in physical health  Historical Factors: NA  Risk Reduction Factors:   Sense of responsibility to family, Employed, Living with another person, especially a relative and Positive social support  Continued Clinical Symptoms:  Depression:   Impulsivity  Cognitive Features That Contribute To Risk:  None    Suicide Risk:  Minimal: No identifiable suicidal ideation.  Patients presenting with no risk factors but with morbid ruminations; may be classified as minimal risk based on the severity of the depressive symptoms  Principal Problem: MDD (major depressive disorder), recurrent episode, severe Discharge Diagnoses:  Patient Active Problem List   Diagnosis Date Noted  . GAD (generalized anxiety disorder) [F41.1] 10/11/2014  . Panic attacks [F41.0] 10/11/2014  . MDD (major depressive disorder), recurrent episode, severe [F33.2] 10/10/2014  . Diabetic ketoacidosis without coma associated with type 1 diabetes mellitus [E10.10]   . SOB (shortness of breath) [R06.02]   . DKA (diabetic ketoacidoses) [E13.10] 07/27/2014  Follow-up Information    Follow up with Olene Craven On 10/17/2014.   Why:  at 4:00pm for therapy   Contact information:   Location    [address]  [place], [place] [address] [phone]       Follow up with [facility] at  [facility] On 10/29/2014.   Specialty:  Family  Medicine   Why:  with Dr. Beverely Low at 2:00pm   Contact information:   [address] [place] [address] [phone]      Plan Of Care/Follow-up recommendations:  Activity:  as tolerated Diet:  regular Follow up as above Is patient on multiple antipsychotic therapies at discharge:  No   Has Patient had three or more failed trials of antipsychotic monotherapy by history:  No  Recommended Plan for Multiple Antipsychotic Therapies: NA    Mitsuru Dault A 10/12/2014, 12:10 PM

## 2014-10-12 NOTE — Progress Notes (Signed)
Recreation Therapy Notes  Date: 07.29.16 Time: 9:30 am Location: [address] Group Room  Group Topic: Stress Management  Goal Area(s) Addresses:  Patient will verbalize importance of using healthy stress management.  Patient will identify positive emotions associated with healthy stress management.   Intervention: Stress Management  Activity :  Progressive Muscle Relaxation.  LRT introduced and educated patients on the technique of progressive muscle relaxation.  A script was used to deliver the technique to the patients.  Patients were asked to follow the script read a loud by the LRT to engage in practicing the stress management technique.  Education:  Stress Management, Discharge Planning.   Education Outcome: Acknowledges edcuation/In group clarification offered/Needs additional education  Clinical Observations/Feedback: Patient did not attend group.   Caroll Rancher, LRT/CTRS         Lillia Abed, Astella Desir A 10/12/2014 3:21 PM

## 2014-10-12 NOTE — Progress Notes (Signed)
  [facility] Adult Case Management Discharge Plan :  Will you be returning to the same living situation after discharge:  Yes,  with parents  At discharge, do you have transportation home?: Yes,  mom picking her up Do you have the ability to pay for your medications: Yes,  patient provided with samples and RX  Release of information consent forms completed and in the chart;  Patient's signature needed at discharge.  Patient to Follow up at: Follow-up Information    Follow up with Olene Craven On 10/17/2014.   Why:  at 4:00pm for therapy   Contact information:   Location    [address]  [place], [place] [address] [phone]       Follow up with [facility] at  [facility] On 10/29/2014.   Specialty:  Family Medicine   Why:  with Dr. Beverely Low at 2:00pm   Contact information:   [address], [address] [place] [address] [phone]      Patient denies SI/HI: Yes,  patient denies    Safety Planning and Suicide Prevention discussed: Yes,  with mom- see Suicide Prevention Note  Have you used any form of tobacco in the last 30 days? (Cigarettes, Smokeless Tobacco, Cigars, and/or Pipes): No  Has patient been referred to the Quitline?: N/A patient is not a smoker  Liliana Cline 10/12/2014, 1:10 PM

## 2014-10-12 NOTE — Progress Notes (Signed)
Pt reports she has had a good, informative day.  She was able to consult with the diabetic coordinator at [facility] to talk about her recent dx of Type I diabetes.  She says it was very helpful.  She had a good visit with her parents tonight.  She denies SI/HI/AVH this evening.  She says she discussed the circumstances that led to her admission with the MD and she will probably be discharged home tomorrow.  She was concerned about the nighttime sliding scale insulin that she would receive and requested that her CBG be checked around 3 am.  Pt was assured she would be awakened and her CBG checked at this time.  Pt is pleasant and cooperative with staff.  Minimal interaction with her peers has been observed.  Pt makes her needs and concerns known to staff.  Support and encouragement offered.  Safety maintained with q15 minute checks.

## 2014-10-12 NOTE — Progress Notes (Signed)
Discharge note:  Patient discharged home per MD order.  Patient received all personal belongings, prescriptions and medication samples.  Reviewed discharge instructions and follow up with patient and she indicated understanding.  She denies SI/HI/AVH.  Patient left ambulatory with mother.

## 2014-10-12 NOTE — Discharge Summary (Signed)
Physician Discharge Summary Note  Patient:  Michelle Riley is an 29 y.o., female MRN:  [id number] DOB:  October 25, 1985 Patient phone:  [phone] (home)  Patient address:   [address] [place] [address],  Total Time spent with patient: 30 minutes  Date of Admission:  10/10/2014 Date of Discharge: 10/12/2014  Reason for Admission:  Mood stabilization treatments  Principal Problem: MDD (major depressive disorder), recurrent episode, severe Discharge Diagnoses: Patient Active Problem List   Diagnosis Date Noted  . Major depressive disorder, recurrent, severe without psychotic features [F33.2]   . GAD (generalized anxiety disorder) [F41.1] 10/11/2014  . Panic attacks [F41.0] 10/11/2014  . MDD (major depressive disorder), recurrent episode, severe [F33.2] 10/10/2014  . Diabetic ketoacidosis without coma associated with type 1 diabetes mellitus [E10.10]   . SOB (shortness of breath) [R06.02]   . DKA (diabetic ketoacidoses) [E13.10] 07/27/2014    Musculoskeletal: Strength & Muscle Tone: within normal limits Gait & Station: normal Patient leans: N/A  Psychiatric Specialty Exam: Physical Exam  Psychiatric: She has a normal mood and affect. Her speech is normal and behavior is normal. Judgment and thought content normal. Cognition and memory are normal.    Review of Systems  Constitutional: Negative.   HENT: Negative.   Eyes: Negative.   Respiratory: Negative.   Cardiovascular: Negative.   Gastrointestinal: Negative.   Genitourinary: Negative.   Musculoskeletal: Negative.   Skin: Negative.   Neurological: Negative.   Endo/Heme/Allergies: Negative.   Psychiatric/Behavioral: Positive for depression (Stabilized ). Negative for suicidal ideas, hallucinations, memory loss and substance abuse. The patient is nervous/anxious (Stabilized with treatments). The patient does not have insomnia.     Blood pressure 110/57, pulse 76, temperature 98.3 F (36.8 C), temperature source  Oral, resp. rate 16, height  (1.651 m), weight 71.668 kg (158 lb), last menstrual period 10/10/2014, SpO2 99 %.Body mass index is 26.29 kg/(m^2).  See Physician SRA     Have you used any form of tobacco in the last 30 days? (Cigarettes, Smokeless Tobacco, Cigars, and/or Pipes): No  Has this patient used any form of tobacco in the last 30 days? (Cigarettes, Smokeless Tobacco, Cigars, and/or Pipes) No  Past Medical History:  Past Medical History  Diagnosis Date  . Diabetes mellitus without complication   . Chronic benign neutropenia     Past Surgical History  Procedure Laterality Date  . Tonsillectomy    . Knee surgery    . Tonsillectomy and adenoidectomy     Family History:  Family History  Problem Relation Age of Onset  . Diabetes Mellitus II Maternal Grandmother   . Diabetes Mellitus II Paternal Grandfather   . Leukemia Paternal Grandfather    Social History:  History  Alcohol Use No     History  Drug Use No    History   Social History  . Marital Status: Single    Spouse Name: N/A  . Number of Children: N/A  . Years of Education: N/A   Social History Main Topics  . Smoking status: Never Smoker   . Smokeless tobacco: Never Used  . Alcohol Use: No  . Drug Use: No  . Sexual Activity: Yes    Birth Control/ Protection: Pill   Other Topics Concern  . None   Social History Narrative    Risk to Self: Is patient at risk for suicide?: No What has been your use of drugs/alcohol within the last 12 months?:  (denies) Risk to Michelle HILTUNENInpatient Therapy:  Prior Outpatient Therapy:    Level of Care:  OP  Hospital Course:   BRIANNON Riley is a pleasant 29 y.o. female who came initially on her own volition to [facility]. She states that her main problem and aways has been, is her anxiety disorder and panic attacks. 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 She reports back in May 2016  she was diagnosed with Type 1 DM. She then became more depressed and went back to her PCP to be placed on an antidepressant. She was initially on Lexapro 2 years ago. "Instead of the PCP increasing her Lexapro to 20 (she had been on 10 mg), she switched me to Paxil. My mood became worse and I started to have SI. I did not ever before that. 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 She described being too caught up in the children's lives and she internalized many things. She broke down after that.Pt discloses after taking Paxil, she took her insulin that night w/out eating hoping to fall into a diabetic coma and die. Pt expresses relief that her suicide attempt was unsuccessful, but still endorses SI with no plan. Pt denies HI or AVH. Pt strongly believes that she needs a change in medication and is not interested in continuing to get psychotropic meds from her primary care doctor.          SAHITI JOSWICK was admitted to the adult 400 unit where she was evaluated and her symptoms were identified. Medication management was discussed and implemented. Her Lexapro was increased to 20 mg daily for depression. Patient was started on Buspar 7.5 mg BID for symptoms of anxiety. She was encouraged to participate in unit programming. Medical problems were identified and treated appropriately. Her insulin was continued for management of Type 1 Diabetes. Patient reported that it was very helpful for her to consult with the diabetic coordinator about her recent diagnosis of Diabetes. Home medication was restarted as needed.  She was evaluated each day by a clinical provider to ascertain the patient's response to treatment.  Improvement was noted by the patient's report of decreasing symptoms, improved sleep and appetite, affect, medication tolerance, behavior, and participation in unit programming.  The patient was asked each day to  complete a self inventory noting mood, mental status, pain, new symptoms, anxiety and concerns.         She responded well to medication and being in a therapeutic and supportive environment. Positive and appropriate behavior was noted and the patient was motivated for recovery.  She worked closely with the treatment team and case manager to develop a discharge plan with appropriate goals. Coping skills, problem solving as well as relaxation therapies were also part of the unit programming.         By the day of discharge she was in much improved condition than upon admission.  Symptoms were reported as significantly decreased or resolved completely. The patient denied SI/HI and voiced no AVH. She was motivated to continue taking medication with a goal of continued improvement in mental health.   NIMRA PUCCINELLI was discharged home with a plan to follow up as noted below. The patient was provided with sample medications and prescriptions at time of discharge. She left [facility] in stable condition with all belongings returned to her.   Consults:  None  Significant  Diagnostic Studies:  Blood glucose monitoring, Chemistry panel, CBC, negative pregnancy test, UDS  Discharge Vitals:   Blood pressure 110/57, pulse 76, temperature 98.3 F (36.8 C), temperature source Oral, resp. rate 16, height 5\' 5"  (1.651 m), weight 71.668 kg (158 lb), last menstrual period 10/10/2014, SpO2 99 %. Body mass index is 26.29 kg/(m^2). Lab Results:   Results for orders placed or performed during the hospital encounter of 10/10/14 (from the past 72 hour(s))  Glucose, capillary     Status: Abnormal   Collection Time: 10/10/14 10:53 PM  Result Value Ref Range   Glucose-Capillary 131 (H) 65 - 99 mg/dL  Glucose, capillary     Status: Abnormal   Collection Time: 10/11/14  6:12 AM  Result Value Ref Range   Glucose-Capillary 62 (L) 65 - 99 mg/dL  Glucose, capillary     Status: Abnormal   Collection Time: 10/11/14  7:21 AM   Result Value Ref Range   Glucose-Capillary 127 (H) 65 - 99 mg/dL  Glucose, capillary     Status: Abnormal   Collection Time: 10/11/14 11:59 AM  Result Value Ref Range   Glucose-Capillary 196 (H) 65 - 99 mg/dL   Comment 1 Notify RN    Comment 2 Document in Chart   Glucose, capillary     Status: Abnormal   Collection Time: 10/11/14  4:55 PM  Result Value Ref Range   Glucose-Capillary 171 (H) 65 - 99 mg/dL   Comment 1 Notify RN    Comment 2 Document in Chart   Glucose, capillary     Status: Abnormal   Collection Time: 10/11/14  9:36 PM  Result Value Ref Range   Glucose-Capillary 217 (H) 65 - 99 mg/dL  Glucose, capillary     Status: None   Collection Time: 10/12/14  2:54 AM  Result Value Ref Range   Glucose-Capillary 98 65 - 99 mg/dL  Glucose, capillary     Status: None   Collection Time: 10/12/14  6:09 AM  Result Value Ref Range   Glucose-Capillary 80 65 - 99 mg/dL  Glucose, capillary     Status: None   Collection Time: 10/12/14 11:48 AM  Result Value Ref Range   Glucose-Capillary 91 65 - 99 mg/dL   Comment 1 Notify RN    Comment 2 Document in Chart     Physical Findings: AIMS: Facial and Oral Movements Muscles of Facial Expression: None, normal Lips and Perioral Area: None, normal Jaw: None, normal Tongue: None, normal,Extremity Movements Upper (arms, wrists, hands, fingers): None, normal Lower (legs, knees, ankles, toes): None, normal, Trunk Movements Neck, shoulders, hips: None, normal, Overall Severity Severity of abnormal movements (highest score from questions above): None, normal Incapacitation due to abnormal movements: None, normal Patient's awareness of abnormal movements (rate only patient's report): No Awareness, Dental Status Current problems with teeth and/or dentures?: No Does patient usually wear dentures?: No  CIWA:  CIWA-Ar Total: 3 COWS:  COWS Total Score: 2   See Psychiatric Specialty Exam and Suicide Risk Assessment completed by Attending  Physician prior to discharge.  Discharge destination:  Home  Is patient on multiple antipsychotic therapies at discharge:  No   Has Patient had three or more failed trials of antipsychotic monotherapy by history:  No    Recommended Plan for Multiple Antipsychotic Therapies: NA      Discharge Instructions    Discharge instructions    Complete by:  As directed   Please follow up as scheduled with your Primary Care Provider for continued  management of medical conditions such as Type 1 Diabetes.            Medication List    TAKE these medications      Indication   atenolol 25 MG tablet  Commonly known as:  TENORMIN  Take 1 tablet (25 mg total) by mouth daily.   Indication:  Episodic Migraine Headache Prophylaxis     busPIRone 7.5 MG tablet  Commonly known as:  BUSPAR  Take 1 tablet (7.5 mg total) by mouth 2 (two) times daily.   Indication:  Anxiety Disorder     butalbital-acetaminophen-caffeine 50-325-40 MG per tablet  Commonly known as:  FIORICET, ESGIC  Take 1 tablet by mouth 2 (two) times daily as needed for headache.      cetirizine 10 MG tablet  Commonly known as:  ZYRTEC  Take 10 mg by mouth as needed for allergies.      clonazePAM 0.5 MG tablet  Commonly known as:  KLONOPIN  Take 1 tablet (0.5 mg total) by mouth 2 (two) times daily as needed (panic attacks).   Indication:  anxiety     escitalopram 20 MG tablet  Commonly known as:  LEXAPRO  Take 1 tablet (20 mg total) by mouth at bedtime.   Indication:  Depression, Generalized Anxiety Disorder     GLUCAGEN HYPOKIT 1 MG Solr injection  Generic drug:  glucagon  Inject 1 application into the skin once. FOR HYPOGLYCEMIA      insulin aspart 100 UNIT/ML FlexPen  Commonly known as:  NOVOLOG FLEXPEN  Inject 0-9 Units into the skin 3 (three) times daily with meals. 1 unit per every 50 points over 100 Plus 1 unit for every 10 carbs      insulin detemir 100 UNIT/ML injection  Commonly known as:  LEVEMIR  Inject  0.23 mLs (23 Units total) into the skin 2 (two) times daily.   Indication:  Insulin-Dependent Diabetes     MONONESSA 0.25-35 MG-MCG tablet  Generic drug:  norgestimate-ethinyl estradiol  Take 1 tablet by mouth at bedtime.   Indication:  Pregnancy     phenylephrine 10 MG Tabs tablet  Commonly known as:  SUDAFED PE  Take 10 mg by mouth every 4 (four) hours as needed (for sinus congestion).      SUMAtriptan 100 MG tablet  Commonly known as:  IMITREX  Take 50 mg by mouth every 2 (two) hours as needed for migraine. May repeat in 2 hours if headache persists or recurs.        Follow-up Information    Follow up with Olene Craven On 10/17/2014.   Why:  at 4:00pm for therapy   Contact information:   Location    [address]  [place], [place] [address] [phone]       [facility] with [facility]  [facility] On 10/29/2014.   Specialty:  Family Medicine   Why:  with Dr. Beverely Low at 2:00pm   Contact information:   [address] [place] [address] [phone]      Follow-up recommendations:    Activity: as tolerated Diet: regular Follow up as above  Comments:   Take all your medications as prescribed by your mental healthcare provider.  Report any adverse effects and or reactions from your medicines to your outpatient provider promptly.  Patient is instructed and cautioned to not engage in alcohol and or illegal drug use while on prescription medicines.  In the event of worsening symptoms,  patient is instructed to call the crisis hotline, 911 and or go to the nearest ED for appropriate evaluation and treatment of symptoms.  Follow-up with your primary care provider for your other medical issues, concerns and or health care needs.   Total Discharge Time: Greater than 30 minutes  Signed: DAVIS, LAURA NP-C 10/12/2014, 6:13 PM  I personally assessed the patient and formulated the plan Madie Reno A. Dub Mikes, M.D.

## 2014-10-23 LAB — HM DIABETES FOOT EXAM: HM Diabetic Foot Exam: NORMAL

## 2014-10-24 ENCOUNTER — Encounter (HOSPITAL_BASED_OUTPATIENT_CLINIC_OR_DEPARTMENT_OTHER): Payer: Self-pay

## 2014-10-24 ENCOUNTER — Emergency Department (HOSPITAL_BASED_OUTPATIENT_CLINIC_OR_DEPARTMENT_OTHER)
Admission: EM | Admit: 2014-10-24 | Discharge: 2014-10-24 | Disposition: A | Discharge status: Home / Self Care | Payer: BLUE CROSS/BLUE SHIELD | Attending: Emergency Medicine | Admitting: Emergency Medicine

## 2014-10-24 DIAGNOSIS — E11649 Type 2 diabetes mellitus with hypoglycemia without coma: Secondary | ICD-10-CM | POA: Insufficient documentation

## 2014-10-24 DIAGNOSIS — R1084 Generalized abdominal pain: Secondary | ICD-10-CM | POA: Diagnosis not present

## 2014-10-24 DIAGNOSIS — Z79899 Other long term (current) drug therapy: Secondary | ICD-10-CM | POA: Diagnosis not present

## 2014-10-24 DIAGNOSIS — Z862 Personal history of diseases of the blood and blood-forming organs and certain disorders involving the immune mechanism: Secondary | ICD-10-CM | POA: Insufficient documentation

## 2014-10-24 DIAGNOSIS — R110 Nausea: Secondary | ICD-10-CM | POA: Insufficient documentation

## 2014-10-24 DIAGNOSIS — Z794 Long term (current) use of insulin: Secondary | ICD-10-CM | POA: Diagnosis not present

## 2014-10-24 DIAGNOSIS — E162 Hypoglycemia, unspecified: Secondary | ICD-10-CM

## 2014-10-24 LAB — CBC WITH DIFFERENTIAL/PLATELET
BASOS PCT: 0 % (ref 0–1)
Basophils Absolute: 0 10*3/uL (ref 0.0–0.1)
Eosinophils Absolute: 0.1 10*3/uL (ref 0.0–0.7)
Eosinophils Relative: 1 % (ref 0–5)
HEMATOCRIT: 41.6 % (ref 36.0–46.0)
Hemoglobin: 13.9 g/dL (ref 12.0–15.0)
Lymphocytes Relative: 53 % — ABNORMAL HIGH (ref 12–46)
Lymphs Abs: 2.8 10*3/uL (ref 0.7–4.0)
MCH: 32.4 pg (ref 26.0–34.0)
MCHC: 33.4 g/dL (ref 30.0–36.0)
MCV: 97 fL (ref 78.0–100.0)
Monocytes Absolute: 0.4 10*3/uL (ref 0.1–1.0)
Monocytes Relative: 8 % (ref 3–12)
Neutro Abs: 2 10*3/uL (ref 1.7–7.7)
Neutrophils Relative %: 38 % — ABNORMAL LOW (ref 43–77)
PLATELETS: 197 10*3/uL (ref 150–400)
RBC: 4.29 MIL/uL (ref 3.87–5.11)
RDW: 11.2 % — AB (ref 11.5–15.5)
WBC: 5.2 10*3/uL (ref 4.0–10.5)

## 2014-10-24 LAB — COMPREHENSIVE METABOLIC PANEL
ALBUMIN: 3.8 g/dL (ref 3.5–5.0)
ALT: 21 U/L (ref 14–54)
AST: 22 U/L (ref 15–41)
Alkaline Phosphatase: 55 U/L (ref 38–126)
Anion gap: 10 (ref 5–15)
BILIRUBIN TOTAL: 0.3 mg/dL (ref 0.3–1.2)
BUN: 13 mg/dL (ref 6–20)
CO2: 28 mmol/L (ref 22–32)
CREATININE: 0.7 mg/dL (ref 0.44–1.00)
Calcium: 8.8 mg/dL — ABNORMAL LOW (ref 8.9–10.3)
Chloride: 101 mmol/L (ref 101–111)
GFR calc non Af Amer: >60 mL/min (ref >60)
GLUCOSE: 83 mg/dL (ref 65–99)
Potassium: 3.8 mmol/L (ref 3.5–5.1)
Sodium: 139 mmol/L (ref 135–145)
Total Protein: 7 g/dL (ref 6.5–8.1)

## 2014-10-24 LAB — LIPASE, BLOOD: LIPASE: 17 U/L — AB (ref 22–51)

## 2014-10-24 LAB — CBG MONITORING, ED
GLUCOSE-CAPILLARY: 93 mg/dL (ref 65–99)
GLUCOSE-CAPILLARY: 68 mg/dL (ref 65–99)

## 2014-10-24 MED ORDER — ONDANSETRON HCL 4 MG/2ML IJ SOLN
4.0000 mg | Freq: Once | INTRAMUSCULAR | Status: AC
  Administered 2014-10-24: 4 mg via INTRAVENOUS
  Filled 2014-10-24: qty 2

## 2014-10-24 MED ORDER — SODIUM CHLORIDE 0.9 % IV BOLUS (SEPSIS)
1000.0000 mL | Freq: Once | INTRAVENOUS | Status: AC
  Administered 2014-10-24: 1000 mL via INTRAVENOUS

## 2014-10-24 NOTE — Discharge Instructions (Signed)
Hypoglycemia °Hypoglycemia occurs when the glucose in your blood is too low. Glucose is a type of sugar that is your body's main energy source. Hormones, such as insulin and glucagon, control the level of glucose in the blood. Insulin lowers blood glucose and glucagon increases blood glucose. Having too much insulin in your blood stream, or not eating enough food containing sugar, can result in hypoglycemia. Hypoglycemia can happen to people with or without diabetes. It can develop quickly and can be a medical emergency.  °CAUSES  °· Missing or delaying meals. °· Not eating enough carbohydrates at meals. °· Taking too much diabetes medicine. °· Not timing your oral diabetes medicine or insulin doses with meals, snacks, and exercise. °· Nausea and vomiting. °· Certain medicines. °· Severe illnesses, such as hepatitis, kidney disorders, and certain eating disorders. °· Increased activity or exercise without eating something extra or adjusting medicines. °· Drinking too much alcohol. °· A nerve disorder that affects body functions like your heart rate, blood pressure, and digestion (autonomic neuropathy). °· A condition where the stomach muscles do not function properly (gastroparesis). Therefore, medicines and food may not absorb properly. °· Rarely, a tumor of the pancreas can produce too much insulin. °SYMPTOMS  °· Hunger. °· Sweating (diaphoresis). °· Change in body temperature. °· Shakiness. °· Headache. °· Anxiety. °· Lightheadedness. °· Irritability. °· Difficulty concentrating. °· Dry mouth. °· Tingling or numbness in the hands or feet. °· Restless sleep or sleep disturbances. °· Altered speech and coordination. °· Change in mental status. °· Seizures or prolonged convulsions. °· Combativeness. °· Drowsiness (lethargic). °· Weakness. °· Increased heart rate or palpitations. °· Confusion. °· Pale, gray skin color. °· Blurred or double vision. °· Fainting. °DIAGNOSIS  °A physical exam and medical history will be  performed. Your caregiver may make a diagnosis based on your symptoms. Blood tests and other lab tests may be performed to confirm a diagnosis. Once the diagnosis is made, your caregiver will see if your signs and symptoms go away once your blood glucose is raised.  °TREATMENT  °Usually, you can easily treat your hypoglycemia when you notice symptoms. °· Check your blood glucose. If it is less than 70 mg/dl, take one of the following:   °¨ 3-4 glucose tablets.   °¨ ½ cup juice.   °¨ ½ cup regular soda.   °¨ 1 cup skim milk.   °¨ ½-1 tube of glucose gel.   °¨ 5-6 hard candies.   °· Avoid high-fat drinks or food that may delay a rise in blood glucose levels. °· Do not take more than the recommended amount of sugary foods, drinks, gel, or tablets. Doing so will cause your blood glucose to go too high.   °· Wait 10-15 minutes and recheck your blood glucose. If it is still less than 70 mg/dl or below your target range, repeat treatment.   °· Eat a snack if it is more than 1 hour until your next meal.   °There may be a time when your blood glucose may go so low that you are unable to treat yourself at home when you start to notice symptoms. You may need someone to help you. You may even faint or be unable to swallow. If you cannot treat yourself, someone will need to bring you to the hospital.  °HOME CARE INSTRUCTIONS °· If you have diabetes, follow your diabetes management plan by: °¨ Taking your medicines as directed. °¨ Following your exercise plan. °¨ Following your meal plan. Do not skip meals. Eat on time. °¨ Testing your blood   glucose regularly. Check your blood glucose before and after exercise. If you exercise longer or different than usual, be sure to check blood glucose more frequently. °¨ Wearing your medical alert jewelry that says you have diabetes. °· Identify the cause of your hypoglycemia. Then, develop ways to prevent the recurrence of hypoglycemia. °· Do not take a hot bath or shower right after an  insulin shot. °· Always carry treatment with you. Glucose tablets are the easiest to carry. °· If you are going to drink alcohol, drink it only with meals. °· Tell friends or family members ways to keep you safe during a seizure. This may include removing hard or sharp objects from the area or turning you on your side. °· Maintain a healthy weight. °SEEK MEDICAL CARE IF:  °· You are having problems keeping your blood glucose in your target range. °· You are having frequent episodes of hypoglycemia. °· You feel you might be having side effects from your medicines. °· You are not sure why your blood glucose is dropping so low. °· You notice a change in vision or a new problem with your vision. °SEEK IMMEDIATE MEDICAL CARE IF:  °· Confusion develops. °· A change in mental status occurs. °· The inability to swallow develops. °· Fainting occurs. °Document Released: 03/02/2005 Document Revised: 03/07/2013 Document Reviewed: 06/29/2011 °[employer]® Patient Information ©2015 [employer]. This information is not intended to replace advice given to you by your health care provider. Make sure you discuss any questions you have with your health care provider. ° °

## 2014-10-24 NOTE — ED Provider Notes (Signed)
CSN: [id number]     Arrival date & time 10/24/14  1545 History   First MD Initiated Contact with Patient 10/24/14 1557     Chief Complaint  Patient presents with  . Hypoglycemia     (Consider location/radiation/quality/duration/timing/severity/associated sxs/prior Treatment) Patient is a 29 y.o. female presenting with general illness.  Illness Location:  Generalized Quality:  Malaise Severity:  Moderate Onset quality:  Gradual Timing:  Intermittent Progression:  Worsening Chronicity:  New Context:  Dx with DM1 3 months ago, recently decreased levemir nighttime dose Relieved by:  Nothing Worsened by:  Nothing Associated symptoms: abdominal pain and nausea   Associated symptoms: no cough, no diarrhea, no fever and no vomiting     Past Medical History  Diagnosis Date  . Diabetes mellitus without complication   . Chronic benign neutropenia    Past Surgical History  Procedure Laterality Date  . Tonsillectomy    . Knee surgery    . Tonsillectomy and adenoidectomy     Family History  Problem Relation Age of Onset  . Diabetes Mellitus II Maternal Grandmother   . Diabetes Mellitus II Paternal Grandfather   . Leukemia Paternal Grandfather    Social History  Substance Use Topics  . Smoking status: Never Smoker   . Smokeless tobacco: Never Used  . Alcohol Use: No   OB History    No data available     Review of Systems  Constitutional: Negative for fever.  Respiratory: Negative for cough.   Gastrointestinal: Positive for nausea and abdominal pain. Negative for vomiting and diarrhea.  All other systems reviewed and are negative.     Allergies  Paxil  Home Medications   Prior to Admission medications   Medication Sig Start Date End Date Taking? Authorizing Provider  atenolol (TENORMIN) 25 MG tablet Take 1 tablet (25 mg total) by mouth daily. 10/12/14   Thermon Leyland, NP  busPIRone (BUSPAR) 7.5 MG tablet Take 1 tablet (7.5 mg total) by mouth 2 (two) times daily.  10/12/14   Thermon Leyland, NP  butalbital-acetaminophen-caffeine (FIORICET, ESGIC) [phone] MG per tablet Take 1 tablet by mouth 2 (two) times daily as needed for headache.    Historical Provider, MD  cetirizine (ZYRTEC) 10 MG tablet Take 10 mg by mouth as needed for allergies.    Historical Provider, MD  clonazePAM (KLONOPIN) 0.5 MG tablet Take 1 tablet (0.5 mg total) by mouth 2 (two) times daily as needed (panic attacks). 10/12/14   Thermon Leyland, NP  escitalopram (LEXAPRO) 20 MG tablet Take 1 tablet (20 mg total) by mouth at bedtime. 10/12/14   Thermon Leyland, NP  GLUCAGEN HYPOKIT 1 MG SOLR injection Inject 1 application into the skin once. FOR HYPOGLYCEMIA 09/06/14   Historical Provider, MD  insulin aspart (NOVOLOG FLEXPEN) 100 UNIT/ML FlexPen Inject 0-9 Units into the skin 3 (three) times daily with meals. 1 unit per every 50 points over 100 Plus 1 unit for every 10 carbs 10/12/14   Thermon Leyland, NP  insulin detemir (LEVEMIR) 100 UNIT/ML injection Inject 0.23 mLs (23 Units total) into the skin 2 (two) times daily. 10/12/14   Thermon Leyland, NP  MONONESSA 0.25-35 MG-MCG tablet Take 1 tablet by mouth at bedtime. 10/12/14   Thermon Leyland, NP  phenylephrine (SUDAFED PE) 10 MG TABS tablet Take 10 mg by mouth every 4 (four) hours as needed (for sinus congestion).     Historical Provider, MD  SUMAtriptan (IMITREX) 100 MG tablet Take 50 mg by  mouth every 2 (two) hours as needed for migraine. May repeat in 2 hours if headache persists or recurs.    Historical Provider, MD   BP 103/70 mmHg  Pulse 65  Temp(Src) 97.8 F (36.6 C) (Oral)  Resp 16  Ht 5\' 5"  (1.651 m)  Wt 158 lb (71.668 kg)  BMI 26.29 kg/m2  SpO2 98%  LMP 10/10/2014 (Approximate) Physical Exam  Constitutional: She is oriented to person, place, and time. She appears well-developed and well-nourished.  HENT:  Head: Normocephalic and atraumatic.  Right Ear: External ear normal.  Left Ear: External ear normal.  Eyes: Conjunctivae and EOM are  normal. Pupils are equal, round, and reactive to light.  Neck: Normal range of motion. Neck supple.  Cardiovascular: Normal rate, regular rhythm, normal heart sounds and intact distal pulses.   Pulmonary/Chest: Effort normal and breath sounds normal.  Abdominal: Soft. Bowel sounds are normal. There is generalized tenderness.  Musculoskeletal: Normal range of motion.  Neurological: She is alert and oriented to person, place, and time.  Skin: Skin is warm and dry.  Vitals reviewed.   ED Course  Procedures (including critical care time) Labs Review Labs Reviewed  COMPREHENSIVE METABOLIC PANEL - Abnormal; Notable for the following:    Calcium 8.8 (*)    All other components within normal limits  CBC WITH DIFFERENTIAL/PLATELET - Abnormal; Notable for the following:    RDW 11.2 (*)    Neutrophils Relative % 38 (*)    Lymphocytes Relative 53 (*)    All other components within normal limits  LIPASE, BLOOD - Abnormal; Notable for the following:    Lipase 17 (*)    All other components within normal limits  PREGNANCY, URINE  CBG MONITORING, ED  CBG MONITORING, ED  CBG MONITORING, ED    Imaging Review No results found.   EKG Interpretation None      MDM   Final diagnoses:  Hypoglycemia    29 y.o. female with pertinent PMH of DM1, anxiety, depression presents with malaise and labile blood glucose.  Pt states she intermittently feels bad, although is essentially asymptomatic at time of my exam.  Her next appointment with endocrinology is in October, and she was under the assumption that we could arrange for an earlier fu if she was seen here.    Wu unremarkable.  Pt dc with standard return precautions  I have reviewed all laboratory and imaging studies if ordered as above  1. Hypoglycemia         Mirian Mo, MD 10/24/14 1816

## 2014-10-24 NOTE — ED Notes (Signed)
C/o low blood sugar today, light headed, HA-newly dx DM-pt A/O-NAD

## 2014-10-24 NOTE — ED Notes (Signed)
MD at bedside. 

## 2014-10-29 ENCOUNTER — Ambulatory Visit: Payer: BLUE CROSS/BLUE SHIELD | Admitting: Family Medicine

## 2014-10-29 LAB — HEMOGLOBIN A1C: HEMOGLOBIN A1C: 9 % — AB (ref 4.0–6.0)

## 2014-11-01 ENCOUNTER — Encounter: Payer: Self-pay | Admitting: Family Medicine

## 2014-11-01 ENCOUNTER — Ambulatory Visit (INDEPENDENT_AMBULATORY_CARE_PROVIDER_SITE_OTHER): Payer: BLUE CROSS/BLUE SHIELD | Admitting: Family Medicine

## 2014-11-01 VITALS — BP 102/70 | HR 60 | Temp 98.0°F | Resp 16 | Ht 66.0 in | Wt 156.4 lb

## 2014-11-01 DIAGNOSIS — E1010 Type 1 diabetes mellitus with ketoacidosis without coma: Secondary | ICD-10-CM | POA: Diagnosis not present

## 2014-11-01 DIAGNOSIS — F332 Major depressive disorder, recurrent severe without psychotic features: Secondary | ICD-10-CM | POA: Diagnosis not present

## 2014-11-01 MED ORDER — CLONAZEPAM 0.5 MG PO TABS: 0.5000 mg | ORAL_TABLET | Freq: Two times a day (BID) | ORAL | Status: DC

## 2014-11-01 MED ORDER — BUSPIRONE HCL 15 MG PO TABS: 15.0000 mg | ORAL_TABLET | Freq: Two times a day (BID) | ORAL | Status: DC

## 2014-11-01 NOTE — Patient Instructions (Signed)
Follow up in 3-4 weeks to recheck mood Continue the Lexapro  daily Increase the Buspar to  twice daily- new prescription sent to pharmacy Decrease the Clonazepam to 1/2 tab twice daily x2 weeks and then drop the morning dose and continue 1/2 tab nightly x2-3 weeks and then drop the nightly dose as you are able- using it only as a rescue medication Call with any questions or concerns Welcome!  We're glad to have you!!

## 2014-11-01 NOTE — Progress Notes (Signed)
Pre visit review using our clinic review tool, if applicable. No additional management support is needed unless otherwise documented below in the visit note. 

## 2014-11-01 NOTE — Assessment & Plan Note (Signed)
New.  Pt had severe adverse reaction when started on Paxil (suicidal thoughts and attempt) and ended up in Behavioral health.  Pt is doing much better since she stopped Paxil and Lexapro was increased.  Pt is also on Buspar twice daily.  She would like to wean off Klonopin if possible.  Discussed weaning protocol for Klonopin (see AVS).  As pt is attempting to wean, will increase Buspar to  BID.  Reviewed supportive care and red flags that should prompt return.  Pt expressed understanding and is in agreement w/ plan.

## 2014-11-01 NOTE — Progress Notes (Signed)
   Subjective:    Patient ID: Michelle Riley, female    DOB: Jan 02, 1986, 29 y.o.   MRN: [id number]  HPI New to establish.  Previous MD- Zoe Lan  Depression- ongoing issue for pt.  Previous MD prescribed Paxil but this caused suicidal thoughts.  Pt checked herself into Behavioral Health and was started on Lexapro and Buspar 7.5mg  BID.  In the past was on  Lexapro and was 'doing great'.  Stopped lexapro after leaving stressful job but anxiety spiked after diabetes dx.  Asked previous MD to increase dose but MD declined and switched to Paxil.  Within 2 days had suicidal thoughts, had attempt (did not eat and 'took a bunch of insulin to go into a coma').  Pt remains on Clonazepam BID but would like to wean this to prn.  Currently in counseling.  DMI- new dx for pt when she was admitted for DKA on 07/27/14.  Following w/ Dr Talmage Nap.  Now on daily insulin regimen.     Review of Systems For ROS see HPI     Objective:   Physical Exam  Constitutional: She is oriented to person, place, and time. She appears well-developed and well-nourished. No distress.  HENT:  Head: Normocephalic and atraumatic.  Eyes: Conjunctivae and EOM are normal. Pupils are equal, round, and reactive to light.  Neck: Normal range of motion. Neck supple. No thyromegaly present.  Cardiovascular: Normal rate, regular rhythm, normal heart sounds and intact distal pulses.   No murmur heard. Pulmonary/Chest: Effort normal and breath sounds normal. No respiratory distress.  Abdominal: Soft. She exhibits no distension. There is no tenderness.  Musculoskeletal: She exhibits no edema.  Lymphadenopathy:    She has no cervical adenopathy.  Neurological: She is alert and oriented to person, place, and time. No cranial nerve deficit. Coordination normal.  Skin: Skin is warm and dry.  Psychiatric: She has a normal mood and affect. Her behavior is normal.  Vitals reviewed.         Assessment & Plan:

## 2014-11-01 NOTE — Assessment & Plan Note (Signed)
New dx for pt.  She is working w/ Dr Talmage Nap to get her sugars regulated w/ insulin.  Had to go to ER last week and all meds were just recently adjusted.  Will follow along and assist as able.

## 2014-11-08 ENCOUNTER — Telehealth: Payer: Self-pay | Admitting: *Deleted

## 2014-11-08 NOTE — Telephone Encounter (Signed)
Medical records received via fax from [facility]. Forwarded to Kayde/Dr. Beverely Low. JG//CMA

## 2014-11-17 ENCOUNTER — Encounter: Payer: Self-pay | Admitting: Family Medicine

## 2014-11-20 ENCOUNTER — Telehealth: Payer: Self-pay | Admitting: *Deleted

## 2014-11-20 MED ORDER — ESCITALOPRAM OXALATE 20 MG PO TABS: 20.0000 mg | ORAL_TABLET | Freq: Every day | ORAL | Status: DC

## 2014-11-20 NOTE — Telephone Encounter (Signed)
Medication filled to pharmacy as requested.   

## 2014-11-20 NOTE — Telephone Encounter (Signed)
Received form by mail from [facility] [employer]. Forwarded to Dr. Beverely Low. JG//CMA

## 2014-11-23 ENCOUNTER — Ambulatory Visit (INDEPENDENT_AMBULATORY_CARE_PROVIDER_SITE_OTHER): Payer: BLUE CROSS/BLUE SHIELD | Admitting: Family Medicine

## 2014-11-23 ENCOUNTER — Encounter: Payer: Self-pay | Admitting: Family Medicine

## 2014-11-23 VITALS — BP 108/74 | HR 80 | Temp 98.0°F | Resp 16 | Ht 66.0 in | Wt 159.0 lb

## 2014-11-23 DIAGNOSIS — Z23 Encounter for immunization: Secondary | ICD-10-CM | POA: Diagnosis not present

## 2014-11-23 DIAGNOSIS — F332 Major depressive disorder, recurrent severe without psychotic features: Secondary | ICD-10-CM | POA: Diagnosis not present

## 2014-11-23 MED ORDER — BUTALBITAL-APAP-CAFFEINE 50-325-40 MG PO TABS: 1.0000 | ORAL_TABLET | Freq: Two times a day (BID) | ORAL | Status: DC | PRN

## 2014-11-23 NOTE — Progress Notes (Signed)
Pre visit review using our clinic review tool, if applicable. No additional management support is needed unless otherwise documented below in the visit note. 

## 2014-11-23 NOTE — Assessment & Plan Note (Signed)
Pt's sxs are much improved on increased dose of Lexapro and Buspar.  Is doing very well at weaning off Clonazepam.  Applauded her efforts.  Will continue to follow.

## 2014-11-23 NOTE — Patient Instructions (Signed)
Follow up as scheduled No med changes at this time Keep up the good work!  You look great! Decrease the Klonopin to every other night and then wean as able- or not!  It's ok! Call with any questions or concerns Hang in there!!  You're doing amazing!!!

## 2014-11-23 NOTE — Progress Notes (Signed)
   Subjective:    Patient ID: Michelle Riley, female    DOB: July 06, 1985, 29 y.o.   MRN: [id number]  HPI Depression/Anxiety- better controlled w/ increased dose of Lexapro and Buspar.  Pt has weaned to 1/2 tab nightly of Klonopin.  Wants to wean completely but this makes her understandably nervous.  Pt is exercising 5-7 days/week which is a good stress outlet.   Review of Systems For ROS see HPI     Objective:   Physical Exam  Constitutional: She is oriented to person, place, and time. She appears well-developed and well-nourished. No distress.  HENT:  Head: Normocephalic and atraumatic.  Eyes: Conjunctivae and EOM are normal. Pupils are equal, round, and reactive to light.  Neurological: She is alert and oriented to person, place, and time.  Skin: Skin is warm and dry.  Psychiatric: She has a normal mood and affect. Her behavior is normal. Thought content normal.  Vitals reviewed.         Assessment & Plan:

## 2014-11-26 NOTE — Telephone Encounter (Signed)
Faxed by Jill Poling on 11/23/14

## 2014-12-16 ENCOUNTER — Other Ambulatory Visit: Payer: Self-pay | Admitting: Family Medicine

## 2014-12-18 NOTE — Telephone Encounter (Signed)
Last OV 11-23-14 Clonazepam last filled 11-01-14 #60 with 1   

## 2014-12-24 ENCOUNTER — Encounter: Payer: Self-pay | Admitting: Family Medicine

## 2014-12-24 MED ORDER — SUMATRIPTAN SUCCINATE 100 MG PO TABS: 50.0000 mg | ORAL_TABLET | ORAL | Status: DC | PRN

## 2014-12-25 MED ORDER — CLONAZEPAM 0.5 MG PO TABS: 0.5000 mg | ORAL_TABLET | Freq: Two times a day (BID) | ORAL | Status: DC

## 2014-12-25 NOTE — Telephone Encounter (Signed)
Last OV 11-23-14 Clonazepam last filled 11-01-14 #60 with 1

## 2014-12-25 NOTE — Addendum Note (Signed)
Addended by: Geannie Risen on: 12/25/2014 12:33 PM   Modules accepted: Orders

## 2015-01-07 ENCOUNTER — Ambulatory Visit: Payer: BLUE CROSS/BLUE SHIELD | Admitting: Family Medicine

## 2015-01-14 ENCOUNTER — Other Ambulatory Visit: Payer: Self-pay | Admitting: Family Medicine

## 2015-01-14 NOTE — Telephone Encounter (Signed)
Medication filled to pharmacy as requested.   

## 2015-01-14 NOTE — Telephone Encounter (Signed)
Last OV 11-23-14 fioricet last filled 11-23-14 #30 with 1

## 2015-01-16 ENCOUNTER — Encounter (HOSPITAL_BASED_OUTPATIENT_CLINIC_OR_DEPARTMENT_OTHER): Payer: Self-pay

## 2015-01-16 ENCOUNTER — Emergency Department (HOSPITAL_BASED_OUTPATIENT_CLINIC_OR_DEPARTMENT_OTHER)
Admission: EM | Admit: 2015-01-16 | Discharge: 2015-01-17 | Disposition: A | Discharge status: Home / Self Care | Payer: BLUE CROSS/BLUE SHIELD | Attending: Emergency Medicine | Admitting: Emergency Medicine

## 2015-01-16 DIAGNOSIS — Z794 Long term (current) use of insulin: Secondary | ICD-10-CM | POA: Diagnosis not present

## 2015-01-16 DIAGNOSIS — Z862 Personal history of diseases of the blood and blood-forming organs and certain disorders involving the immune mechanism: Secondary | ICD-10-CM | POA: Insufficient documentation

## 2015-01-16 DIAGNOSIS — E1165 Type 2 diabetes mellitus with hyperglycemia: Secondary | ICD-10-CM | POA: Insufficient documentation

## 2015-01-16 DIAGNOSIS — R739 Hyperglycemia, unspecified: Secondary | ICD-10-CM

## 2015-01-16 DIAGNOSIS — F419 Anxiety disorder, unspecified: Secondary | ICD-10-CM | POA: Diagnosis not present

## 2015-01-16 DIAGNOSIS — Z3202 Encounter for pregnancy test, result negative: Secondary | ICD-10-CM | POA: Insufficient documentation

## 2015-01-16 DIAGNOSIS — R1030 Lower abdominal pain, unspecified: Secondary | ICD-10-CM | POA: Diagnosis not present

## 2015-01-16 DIAGNOSIS — Z79899 Other long term (current) drug therapy: Secondary | ICD-10-CM | POA: Insufficient documentation

## 2015-01-16 LAB — URINALYSIS, ROUTINE W REFLEX MICROSCOPIC
Bilirubin Urine: NEGATIVE
Glucose, UA: >1000 mg/dL — AB
Hgb urine dipstick: NEGATIVE
Ketones, ur: NEGATIVE mg/dL
LEUKOCYTES UA: NEGATIVE
NITRITE: NEGATIVE
PH: 6 (ref 5.0–8.0)
PROTEIN: NEGATIVE mg/dL
Specific Gravity, Urine: 1.045 — ABNORMAL HIGH (ref 1.005–1.030)
Urobilinogen, UA: 0.2 mg/dL (ref 0.0–1.0)

## 2015-01-16 LAB — BASIC METABOLIC PANEL
Anion gap: 6 (ref 5–15)
BUN: 17 mg/dL (ref 6–20)
CALCIUM: 8.6 mg/dL — AB (ref 8.9–10.3)
CO2: 27 mmol/L (ref 22–32)
CREATININE: 0.73 mg/dL (ref 0.44–1.00)
Chloride: 104 mmol/L (ref 101–111)
GLUCOSE: 262 mg/dL — AB (ref 65–99)
Potassium: 3.9 mmol/L (ref 3.5–5.1)
Sodium: 137 mmol/L (ref 135–145)

## 2015-01-16 LAB — PREGNANCY, URINE: Preg Test, Ur: NEGATIVE

## 2015-01-16 LAB — CBG MONITORING, ED
Glucose-Capillary: 360 mg/dL — ABNORMAL HIGH (ref 65–99)
Glucose-Capillary: 248 mg/dL — ABNORMAL HIGH (ref 65–99)

## 2015-01-16 LAB — URINE MICROSCOPIC-ADD ON

## 2015-01-16 MED ORDER — SODIUM CHLORIDE 0.9 % IV BOLUS (SEPSIS)
1000.0000 mL | Freq: Once | INTRAVENOUS | Status: AC
  Administered 2015-01-16: 1000 mL via INTRAVENOUS

## 2015-01-16 NOTE — ED Notes (Signed)
cbg 360 

## 2015-01-16 NOTE — ED Notes (Addendum)
C/o BS 491 at 1845-newly dx DM-states she is being seen by endocrinologist- BS "never below 200"

## 2015-01-16 NOTE — ED Provider Notes (Signed)
CSN: [id number]     Arrival date & time 01/16/15  2004 History  By signing my name below, I, Phillis HaggisGabriella Gaje, attest that this documentation has been prepared under the direction and in the presence of Marily MemosJason Sherill Mangen, MD. Electronically Signed: Phillis HaggisGabriella Gaje, ED Scribe. 01/16/2015. 10:47 PM.   Chief Complaint  Patient presents with  . Hyperglycemia   The history is provided by the patient. No language interpreter was used.  HPI Comments: Michelle Riley is a 29 y.o. Female with hx of newly diagnosed DM who presents to the Emergency Department complaining of increased CBG onset 4 hours ago. Pt states that her measured blood sugar was 491 at 645 PM today. She reports dizziness, lightheadedness, abdominal pain that she rates 5/10, "heaviness" in her feet and legs, fatigue, dry mouth, and frequency. Pt is followed by an endocrinologist and was diagnosed following DKA. She states that her current symptoms feel like her DKA symptoms and had positive ketones in her urine at home. She states that her sugars are not very controlled, that they stay around 200. She states that her last dose of insulin was at lunchtime. She denies fever, dysuria, hematuria, or rash.   Past Medical History  Diagnosis Date  . Diabetes mellitus without complication (HCC)   . Chronic benign neutropenia (HCC)   . Anxiety    Past Surgical History  Procedure Laterality Date  . Tonsillectomy    . Knee surgery    . Tonsillectomy and adenoidectomy     Family History  Problem Relation Age of Onset  . Diabetes Mellitus II Maternal Grandmother   . Diabetes Mellitus II Paternal Grandfather   . Leukemia Paternal Grandfather    Social History  Substance Use Topics  . Smoking status: Never Smoker   . Smokeless tobacco: Never Used  . Alcohol Use: No   OB History    No data available     Review of Systems  Constitutional: Positive for fatigue. Negative for fever.  Gastrointestinal: Positive for abdominal pain.   Genitourinary: Positive for frequency. Negative for dysuria and hematuria.  Musculoskeletal: Positive for myalgias. Negative for back pain.  Skin: Negative for rash.  Neurological: Positive for dizziness and light-headedness.  All other systems reviewed and are negative.  Allergies  Paxil  Home Medications   Prior to Admission medications   Medication Sig Start Date End Date Taking? Authorizing Provider  ACCU-CHEK AVIVA PLUS test strip  11/13/14   Historical Provider, MD  atenolol (TENORMIN) 25 MG tablet Take 1 tablet (25 mg total) by mouth daily. 10/12/14   Thermon LeylandLaura A Davis, NP  BD PEN NEEDLE NANO U/F 32G X 4 MM MISC U UTD TID FOR INJECTION 11/16/14   Historical Provider, MD  busPIRone (BUSPAR) 15 MG tablet Take 1 tablet (15 mg total) by mouth 2 (two) times daily. 11/01/14   Sheliah HatchKatherine E Tabori, MD  butalbital-acetaminophen-caffeine (FIORICET, ESGIC) [phone] MG tablet TAKE 1 TABLET BY MOUTH TWICE DAILY AS NEEDED FOR HEADACHE 01/14/15   Sheliah HatchKatherine E Tabori, MD  cetirizine (ZYRTEC) 10 MG tablet Take 10 mg by mouth as needed for allergies.    Historical Provider, MD  clonazePAM (KLONOPIN) 0.5 MG tablet Take 1 tablet (0.5 mg total) by mouth 2 (two) times daily. 12/25/14   Sheliah HatchKatherine E Tabori, MD  escitalopram (LEXAPRO) 20 MG tablet Take 1 tablet (20 mg total) by mouth daily. 11/20/14   Sheliah HatchKatherine E Tabori, MD  GLUCAGEN HYPOKIT 1 MG SOLR injection Inject 1 application into the skin once. FOR  HYPOGLYCEMIA 09/06/14   Historical Provider, MD  insulin aspart (NOVOLOG FLEXPEN) 100 UNIT/ML FlexPen Inject 0-9 Units into the skin 3 (three) times daily with meals. 1 unit per every 50 points over 100 Plus 1 unit for every 10 carbs 10/12/14   Thermon Leyland, NP  LEVEMIR FLEXTOUCH 100 UNIT/ML Pen  11/20/14   Historical Provider, MD  MONONESSA 0.25-35 MG-MCG tablet Take 1 tablet by mouth at bedtime. 10/12/14   Thermon Leyland, NP  phenylephrine (SUDAFED PE) 10 MG TABS tablet Take 10 mg by mouth every 4 (four) hours as needed  (for sinus congestion).     Historical Provider, MD  SUMAtriptan (IMITREX) 100 MG tablet Take 0.5 tablets (50 mg total) by mouth every 2 (two) hours as needed for migraine. May repeat in 2 hours if headache persists or recurs. 12/24/14   Sheliah Hatch, MD   BP 117/81 mmHg  Pulse 60  Temp(Src) 98.1 F (36.7 C) (Oral)  Resp 18  Ht  (1.651 m)  Wt 165 lb (74.844 kg)  BMI 27.46 kg/m2  SpO2 100%  Physical Exam  Constitutional: She is oriented to person, place, and time. She appears well-developed and well-nourished.  HENT:  Head: Normocephalic and atraumatic.  Eyes: EOM are normal. Pupils are equal, round, and reactive to light.  Neck: Normal range of motion. Neck supple.  Cardiovascular: Normal rate, regular rhythm and normal heart sounds.  Exam reveals no gallop and no friction rub.   No murmur heard. Pulmonary/Chest: Effort normal and breath sounds normal. She has no wheezes.  Abdominal: Soft. She exhibits no distension and no mass. There is tenderness. There is no rebound and no guarding.  Minimal suprapubic tenderness; no peritonitis  Musculoskeletal: Normal range of motion.  Neurological: She is alert and oriented to person, place, and time.  Skin: Skin is warm and dry.  Psychiatric: She has a normal mood and affect. Her behavior is normal.  Nursing note and vitals reviewed.   ED Course  Procedures (including critical care time) DIAGNOSTIC STUDIES: Oxygen Saturation is 100% on RA, normal by my interpretation.    COORDINATION OF CARE: 10:45 PM-Discussed treatment plan which includes labs and IV fluids with pt at bedside and pt agreed to plan.   Labs Review Labs Reviewed  URINALYSIS, ROUTINE W REFLEX MICROSCOPIC (NOT AT [facility]) - Abnormal; Notable for the following:    Specific Gravity, Urine 1.045 (*)    Glucose, UA >1000 (*)    All other components within normal limits  BASIC METABOLIC PANEL - Abnormal; Notable for the following:    Glucose, Bld 262 (*)     Calcium 8.6 (*)    All other components within normal limits  CBG MONITORING, ED - Abnormal; Notable for the following:    Glucose-Capillary 360 (*)    All other components within normal limits  CBG MONITORING, ED - Abnormal; Notable for the following:    Glucose-Capillary 248 (*)    All other components within normal limits  PREGNANCY, URINE  URINE MICROSCOPIC-ADD ON    Imaging Review No results found. I have personally reviewed and evaluated these images and lab results as part of my medical decision-making.   EKG Interpretation None      MDM   Final diagnoses:  Hyperglycemia   Here with polyuria and polydipsia and fatigue for the last few days similar to previous episode of DKA. Has had low sugars as high as 500 today even with her sliding scale insulin sucking here for  further evaluation. On initial evaluation patient's vital signs are within normal limits she does not appear to have increased small breathing or any distress. Blood sugar went from 350-260 without intervention. I checked a BMP and a urine both of which were relatively normal aside from hyperglycemia. A liter of fluids were given without any insulin. Patient stable without any urinary tract infection and doubt any emergent abdominal causes for her symptoms likely is from hyperglycemia. She needs better control of her blood sugars and she'll follow with her endocrinologist for further management.  I have personally and contemperaneously reviewed labs and imaging and used in my decision making as above.   A medical screening exam was performed and I feel the patient has had an appropriate workup for their chief complaint at this time and likelihood of emergent condition existing is low. They have been counseled on decision, discharge, follow up and which symptoms necessitate immediate return to the emergency department. They or their family verbally stated understanding and agreement with plan and discharged in stable  condition.   I personally performed the services described in this documentation, which was scribed in my presence. The recorded information has been reviewed and is accurate.     Marily Memos, MD 01/17/15 [phone]

## 2015-01-16 NOTE — ED Notes (Signed)
MD at bedside discussing test results and dispo plan of care. 

## 2015-02-05 ENCOUNTER — Other Ambulatory Visit: Payer: Self-pay | Admitting: Family Medicine

## 2015-02-06 NOTE — Telephone Encounter (Signed)
Medication filled to pharmacy as requested.   

## 2015-02-06 NOTE — Telephone Encounter (Signed)
Last OV 11/23/14 fioricet last filled 01/13/15 #30 with 0

## 2015-02-26 ENCOUNTER — Encounter: Payer: Self-pay | Admitting: Family Medicine

## 2015-02-26 ENCOUNTER — Ambulatory Visit (INDEPENDENT_AMBULATORY_CARE_PROVIDER_SITE_OTHER): Payer: BLUE CROSS/BLUE SHIELD | Admitting: Family Medicine

## 2015-02-26 VITALS — BP 106/76 | HR 82 | Temp 98.0°F | Resp 16 | Ht 65.0 in | Wt 173.5 lb

## 2015-02-26 DIAGNOSIS — G43009 Migraine without aura, not intractable, without status migrainosus: Secondary | ICD-10-CM | POA: Diagnosis not present

## 2015-02-26 DIAGNOSIS — F332 Major depressive disorder, recurrent severe without psychotic features: Secondary | ICD-10-CM

## 2015-02-26 MED ORDER — SERTRALINE HCL 100 MG PO TABS: 100.0000 mg | ORAL_TABLET | Freq: Every day | ORAL | Status: DC

## 2015-02-26 MED ORDER — CLONAZEPAM 1 MG PO TABS: 1.0000 mg | ORAL_TABLET | Freq: Two times a day (BID) | ORAL | Status: DC | PRN

## 2015-02-26 NOTE — Progress Notes (Signed)
   Subjective:    Patient ID: Michelle Riley, female    DOB: 10/17/1985, 29 y.o.   MRN: [id number]  HPI Major Depression- pt has hx of this.  Currently on Lexapro, Buspar, Klonopin.  Pt scored 19 on PHQ 9 today.  Already seeing therapist.  'i'm pretty sure I have seasonal affective disorder'.  Saturday was 2 yr anniversary of grandmother's death, turning 29 on Tuesday, changing jobs.  Doesn't feel Klonopin is working- despite doubling her evening dose.  Feels lexapro is helpful for anxiety but not for mood.  sxs typically 'start mid-November and lasts until March'.  Denies SI/HI.  Was admitted to behavioral health previously for suicidal ideation when switching from Lexapro to Paxil- very nervous about changing meds.   Review of Systems For ROS see HPI     Objective:   Physical Exam  Constitutional: She is oriented to person, place, and time. She appears well-developed and well-nourished. No distress.  HENT:  Head: Normocephalic and atraumatic.  Eyes: Conjunctivae and EOM are normal. Pupils are equal, round, and reactive to light.  Neurological: She is alert and oriented to person, place, and time.  Skin: Skin is warm and dry.  Psychiatric: Her behavior is normal. Thought content normal.  Tearful, anxious  Vitals reviewed.         Assessment & Plan:

## 2015-02-26 NOTE — Patient Instructions (Signed)
Follow up in 2-3 weeks to recheck mood STOP the Lexapro START the Sertraline daily Continue the Buspar Increase the Klonopin to 1mg  BID for anxiety Continue to work with your therapist weekly Call with any questions or concerns If you want to join us at the new [place] office, any scheduled appointments will automatically transfer and we will see you at [address], [place], [place] [id number] (OPENING 03/19/15) Hang in there!!!

## 2015-02-26 NOTE — Progress Notes (Signed)
Pre visit review using our clinic review tool, if applicable. No additional management support is needed unless otherwise documented below in the visit note. 

## 2015-02-27 ENCOUNTER — Telehealth: Payer: Self-pay | Admitting: Family Medicine

## 2015-02-27 NOTE — Telephone Encounter (Signed)
Called and spoke with pharmacist and informed that this was a new prescription. This will be filled today.

## 2015-02-27 NOTE — Telephone Encounter (Signed)
Caller name: [facility]  Can be reached: [phone]  [facility] clonazePAM (KLONOPIN) 1 MG tablet [[id number]][[id number]] Early. Patient states she is changing jobs at the end of the week and need this refill prior to change.

## 2015-02-28 ENCOUNTER — Other Ambulatory Visit: Payer: Self-pay | Admitting: Family Medicine

## 2015-02-28 NOTE — Telephone Encounter (Signed)
Medication filled to pharmacy as requested.   

## 2015-02-28 NOTE — Telephone Encounter (Signed)
Last OV 02/26/15 fioricet last filled 02/06/15 #30 with 0

## 2015-03-14 ENCOUNTER — Encounter: Payer: Self-pay | Admitting: Family Medicine

## 2015-03-20 ENCOUNTER — Ambulatory Visit: Payer: Self-pay | Admitting: Family Medicine

## 2015-03-25 ENCOUNTER — Other Ambulatory Visit: Payer: Self-pay | Admitting: Family Medicine

## 2015-03-29 ENCOUNTER — Other Ambulatory Visit: Payer: Self-pay | Admitting: Family Medicine

## 2015-04-08 ENCOUNTER — Ambulatory Visit: Payer: Self-pay | Admitting: Neurology

## 2015-05-17 ENCOUNTER — Ambulatory Visit: Payer: Self-pay | Admitting: Neurology

## 2015-05-17 DIAGNOSIS — Z029 Encounter for administrative examinations, unspecified: Secondary | ICD-10-CM

## 2015-05-20 ENCOUNTER — Ambulatory Visit (INDEPENDENT_AMBULATORY_CARE_PROVIDER_SITE_OTHER): Payer: 59 | Admitting: Family Medicine

## 2015-05-20 ENCOUNTER — Encounter: Payer: Self-pay | Admitting: Family Medicine

## 2015-05-20 ENCOUNTER — Telehealth: Payer: Self-pay | Admitting: Family Medicine

## 2015-05-20 VITALS — BP 110/70 | HR 81 | Temp 98.0°F | Resp 16 | Ht 65.0 in | Wt 168.5 lb

## 2015-05-20 DIAGNOSIS — F332 Major depressive disorder, recurrent severe without psychotic features: Secondary | ICD-10-CM

## 2015-05-20 DIAGNOSIS — F411 Generalized anxiety disorder: Secondary | ICD-10-CM

## 2015-05-20 MED ORDER — TRAZODONE HCL 50 MG PO TABS: 25.0000 mg | ORAL_TABLET | Freq: Every evening | ORAL | Status: DC | PRN

## 2015-05-20 MED ORDER — SERTRALINE HCL 100 MG PO TABS: 100.0000 mg | ORAL_TABLET | Freq: Every day | ORAL | Status: DC

## 2015-05-20 MED ORDER — CLONAZEPAM 1 MG PO TABS: 1.0000 mg | ORAL_TABLET | Freq: Two times a day (BID) | ORAL | Status: DC | PRN

## 2015-05-20 MED ORDER — BUSPIRONE HCL 15 MG PO TABS: 15.0000 mg | ORAL_TABLET | Freq: Two times a day (BID) | ORAL | Status: DC

## 2015-05-20 NOTE — Telephone Encounter (Signed)
Ok to refill with the understanding that she is not to use more than 3 pills daily.  Using 60 pills in 10 days is much too much.  Will not refill this medication early in the future

## 2015-05-20 NOTE — Telephone Encounter (Signed)
Bryan [phone][facility] calling, patient had klonopin 1mg  [facility] ago. Ok to fill early?
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
## 2015-05-20 NOTE — Telephone Encounter (Signed)
Called pt and was advised that pt used all 60 pills. Pt advised that she was taking 2 tablets to sleep and then wake up and take 2 more tablets more to fall back to sleep. Then pt was taking in the afternoon as well.
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
## 2015-05-20 NOTE — Telephone Encounter (Signed)
Judie GrieveBryan called to f/u on RX. Informed that nurse will call or fax updates.

## 2015-05-20 NOTE — Telephone Encounter (Signed)
Spoke with the pharmacist and gave ok for early refill.

## 2015-05-20 NOTE — Progress Notes (Signed)
Pre visit review using our clinic review tool, if applicable. No additional management support is needed unless otherwise documented below in the visit note. 

## 2015-05-20 NOTE — Telephone Encounter (Signed)
Pt's medication was filled on 2/24.  Please ask pt how many pills she has left as this would be 60 pills in 10 days.
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
## 2015-05-21 NOTE — Assessment & Plan Note (Signed)
Deteriorated.  See above. 

## 2015-05-22 ENCOUNTER — Encounter: Payer: Self-pay | Admitting: Family Medicine

## 2015-05-22 MED ORDER — DOXEPIN HCL 10 MG PO CAPS
10.0000 mg | ORAL_CAPSULE | Freq: Every day | ORAL | Status: DC
Start: 2015-05-22 — End: 2015-11-13

## 2015-06-03 ENCOUNTER — Telehealth: Payer: Self-pay | Admitting: Family Medicine

## 2015-06-03 ENCOUNTER — Ambulatory Visit: Payer: 59 | Admitting: Family Medicine

## 2015-06-03 NOTE — Telephone Encounter (Signed)
Called pt and advised that the note she received did not have a return to work date. It only stated that pt was following up with PCP in 2 weeks. Advised pt that that note covers her until she sees PCP again. No new note was written.

## 2015-06-03 NOTE — Telephone Encounter (Signed)
Pt says that she was written out of work until today, However PCP is out of the office today. Pt rescheduled her appt for tomorrow morning at 10:30. Pt would like to have a work note extending her time out of work.    CB: [phone]

## 2015-06-04 ENCOUNTER — Ambulatory Visit (INDEPENDENT_AMBULATORY_CARE_PROVIDER_SITE_OTHER): Payer: 59 | Admitting: Licensed Clinical Social Worker

## 2015-06-04 ENCOUNTER — Ambulatory Visit: Payer: 59 | Admitting: Family Medicine

## 2015-06-04 DIAGNOSIS — F4310 Post-traumatic stress disorder, unspecified: Secondary | ICD-10-CM

## 2015-06-06 ENCOUNTER — Ambulatory Visit: Payer: 59 | Admitting: Family Medicine

## 2015-06-06 LAB — HEMOGLOBIN A1C: HEMOGLOBIN A1C: 7.4

## 2015-06-10 ENCOUNTER — Encounter: Payer: Self-pay | Admitting: Family Medicine

## 2015-06-10 ENCOUNTER — Ambulatory Visit (INDEPENDENT_AMBULATORY_CARE_PROVIDER_SITE_OTHER): Payer: 59 | Admitting: Family Medicine

## 2015-06-10 VITALS — BP 118/68 | HR 90 | Temp 98.0°F | Resp 16 | Ht 65.0 in | Wt 166.0 lb

## 2015-06-10 DIAGNOSIS — F332 Major depressive disorder, recurrent severe without psychotic features: Secondary | ICD-10-CM

## 2015-06-10 MED ORDER — MIRTAZAPINE 15 MG PO TBDP: 15.0000 mg | ORAL_TABLET | Freq: Every day | ORAL | Status: DC

## 2015-06-10 NOTE — Progress Notes (Signed)
Pre visit review using our clinic review tool, if applicable. No additional management support is needed unless otherwise documented below in the visit note. 
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
## 2015-06-10 NOTE — Patient Instructions (Signed)
Follow up in 1 month to recheck anxiety Add the Remeron nightly to improve sleep, mood, appetite Please try and get back into regular routine Call with any questions or concerns Hang in there!!!

## 2015-06-11 NOTE — Assessment & Plan Note (Signed)
Ongoing issue.  Pt is seeing a counselor weekly at this point.  She does not feel she is able to return to work b/c the idea of going to a clients' home causes panic.  Will hold on making a return to work decision until after pt sees her counselor on Wednesday and we are able to touch base.  Will add Remeron due to decreased appetite and poor sleep.  Discussed need for pt to resume routine.  Will continue to follow closely.

## 2015-06-12 ENCOUNTER — Ambulatory Visit (INDEPENDENT_AMBULATORY_CARE_PROVIDER_SITE_OTHER): Payer: 59 | Admitting: Licensed Clinical Social Worker

## 2015-06-12 ENCOUNTER — Encounter: Payer: Self-pay | Admitting: Family Medicine

## 2015-06-12 DIAGNOSIS — F4310 Post-traumatic stress disorder, unspecified: Secondary | ICD-10-CM | POA: Diagnosis not present

## 2015-06-14 ENCOUNTER — Telehealth: Payer: Self-pay | Admitting: *Deleted

## 2015-06-14 NOTE — Telephone Encounter (Signed)
Received request for Workman's Comp case Medical records from 05/09/15 thru 08/06/15 for Dr. Beverely Lowabori & Berniece AndreasJulie Whitt; forwarded to [place] for email/scan//SLS 03/31

## 2015-06-17 ENCOUNTER — Ambulatory Visit (INDEPENDENT_AMBULATORY_CARE_PROVIDER_SITE_OTHER): Payer: 59 | Admitting: Licensed Clinical Social Worker

## 2015-06-17 DIAGNOSIS — F4310 Post-traumatic stress disorder, unspecified: Secondary | ICD-10-CM

## 2015-06-25 ENCOUNTER — Ambulatory Visit (INDEPENDENT_AMBULATORY_CARE_PROVIDER_SITE_OTHER): Payer: 59 | Admitting: Licensed Clinical Social Worker

## 2015-06-25 DIAGNOSIS — F4310 Post-traumatic stress disorder, unspecified: Secondary | ICD-10-CM | POA: Diagnosis not present

## 2015-07-01 ENCOUNTER — Encounter: Payer: Self-pay | Admitting: General Practice

## 2015-07-03 ENCOUNTER — Ambulatory Visit (INDEPENDENT_AMBULATORY_CARE_PROVIDER_SITE_OTHER): Payer: 59 | Admitting: Licensed Clinical Social Worker

## 2015-07-03 DIAGNOSIS — F3341 Major depressive disorder, recurrent, in partial remission: Secondary | ICD-10-CM

## 2015-07-10 ENCOUNTER — Ambulatory Visit: Payer: 59 | Admitting: Licensed Clinical Social Worker

## 2015-07-10 ENCOUNTER — Encounter: Payer: Self-pay | Admitting: Family Medicine

## 2015-07-10 ENCOUNTER — Ambulatory Visit (INDEPENDENT_AMBULATORY_CARE_PROVIDER_SITE_OTHER): Payer: 59 | Admitting: Family Medicine

## 2015-07-10 VITALS — BP 110/76 | HR 84 | Temp 98.1°F | Resp 16 | Ht 66.0 in | Wt 171.4 lb

## 2015-07-10 DIAGNOSIS — F332 Major depressive disorder, recurrent severe without psychotic features: Secondary | ICD-10-CM

## 2015-07-10 NOTE — Patient Instructions (Signed)
Follow up as needed Continue medications as they are for now- no changes at this time Continue to follow up w/ Raynelle FanningJulie- you're making big strides! I'm so glad that you are back at work! Call with any questions or concerns Hang in there!

## 2015-07-10 NOTE — Assessment & Plan Note (Signed)
Stable at this time.  No med changes.  Pt is now back at work.  Having benefit from counseling w/ Raynelle FanningJulie.  Total time spent w/ pt 15 minutes, >50% spent counseling.  Will continue to follow.
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
## 2015-07-10 NOTE — Progress Notes (Signed)
Pre visit review using our clinic review tool, if applicable. No additional management support is needed unless otherwise documented below in the visit note. 

## 2015-07-17 ENCOUNTER — Encounter: Payer: Self-pay | Admitting: Family Medicine

## 2015-07-21 ENCOUNTER — Other Ambulatory Visit: Payer: Self-pay | Admitting: Family Medicine

## 2015-07-22 NOTE — Telephone Encounter (Signed)
Last OV 07/10/15 Clonazepam last filled 05/20/15 #60 with 1

## 2015-07-22 NOTE — Telephone Encounter (Signed)
Medication filled to pharmacy as requested.   

## 2015-07-24 ENCOUNTER — Ambulatory Visit (INDEPENDENT_AMBULATORY_CARE_PROVIDER_SITE_OTHER): Payer: 59 | Admitting: Licensed Clinical Social Worker

## 2015-07-24 DIAGNOSIS — F3341 Major depressive disorder, recurrent, in partial remission: Secondary | ICD-10-CM

## 2015-07-31 ENCOUNTER — Ambulatory Visit: Payer: 59 | Admitting: Licensed Clinical Social Worker

## 2015-08-07 ENCOUNTER — Ambulatory Visit (INDEPENDENT_AMBULATORY_CARE_PROVIDER_SITE_OTHER): Payer: 59 | Admitting: Licensed Clinical Social Worker

## 2015-08-07 DIAGNOSIS — F3341 Major depressive disorder, recurrent, in partial remission: Secondary | ICD-10-CM

## 2015-08-16 ENCOUNTER — Ambulatory Visit (INDEPENDENT_AMBULATORY_CARE_PROVIDER_SITE_OTHER): Payer: 59 | Admitting: Licensed Clinical Social Worker

## 2015-08-16 DIAGNOSIS — F3341 Major depressive disorder, recurrent, in partial remission: Secondary | ICD-10-CM

## 2015-08-23 ENCOUNTER — Other Ambulatory Visit: Payer: Self-pay | Admitting: General Practice

## 2015-08-23 MED ORDER — CLONAZEPAM 1 MG PO TABS: 1.0000 mg | ORAL_TABLET | Freq: Two times a day (BID) | ORAL | Status: DC | PRN

## 2015-08-23 NOTE — Telephone Encounter (Signed)
Last OV 07/10/15 Clonazepam last filled 07/22/15 #60 with 0

## 2015-08-23 NOTE — Telephone Encounter (Signed)
Medication filled to pharmacy as requested.   

## 2015-08-26 ENCOUNTER — Ambulatory Visit (INDEPENDENT_AMBULATORY_CARE_PROVIDER_SITE_OTHER): Payer: 59 | Admitting: Licensed Clinical Social Worker

## 2015-08-26 DIAGNOSIS — F3341 Major depressive disorder, recurrent, in partial remission: Secondary | ICD-10-CM

## 2015-09-02 ENCOUNTER — Ambulatory Visit (INDEPENDENT_AMBULATORY_CARE_PROVIDER_SITE_OTHER): Payer: 59 | Admitting: Licensed Clinical Social Worker

## 2015-09-02 DIAGNOSIS — F3341 Major depressive disorder, recurrent, in partial remission: Secondary | ICD-10-CM | POA: Diagnosis not present

## 2015-09-09 ENCOUNTER — Ambulatory Visit (INDEPENDENT_AMBULATORY_CARE_PROVIDER_SITE_OTHER): Payer: 59 | Admitting: Licensed Clinical Social Worker

## 2015-09-09 DIAGNOSIS — F3341 Major depressive disorder, recurrent, in partial remission: Secondary | ICD-10-CM | POA: Diagnosis not present

## 2015-09-10 ENCOUNTER — Emergency Department (HOSPITAL_BASED_OUTPATIENT_CLINIC_OR_DEPARTMENT_OTHER)
Admission: EM | Admit: 2015-09-10 | Discharge: 2015-09-10 | Disposition: A | Discharge status: Home / Self Care | Payer: 59 | Attending: Emergency Medicine | Admitting: Emergency Medicine

## 2015-09-10 ENCOUNTER — Encounter (HOSPITAL_BASED_OUTPATIENT_CLINIC_OR_DEPARTMENT_OTHER): Payer: Self-pay | Admitting: Emergency Medicine

## 2015-09-10 DIAGNOSIS — E1165 Type 2 diabetes mellitus with hyperglycemia: Secondary | ICD-10-CM | POA: Insufficient documentation

## 2015-09-10 DIAGNOSIS — Z794 Long term (current) use of insulin: Secondary | ICD-10-CM | POA: Diagnosis not present

## 2015-09-10 DIAGNOSIS — R109 Unspecified abdominal pain: Secondary | ICD-10-CM | POA: Diagnosis not present

## 2015-09-10 DIAGNOSIS — R0682 Tachypnea, not elsewhere classified: Secondary | ICD-10-CM | POA: Diagnosis not present

## 2015-09-10 DIAGNOSIS — F322 Major depressive disorder, single episode, severe without psychotic features: Secondary | ICD-10-CM | POA: Insufficient documentation

## 2015-09-10 DIAGNOSIS — R824 Acetonuria: Secondary | ICD-10-CM | POA: Diagnosis not present

## 2015-09-10 DIAGNOSIS — E876 Hypokalemia: Secondary | ICD-10-CM | POA: Insufficient documentation

## 2015-09-10 DIAGNOSIS — R739 Hyperglycemia, unspecified: Secondary | ICD-10-CM

## 2015-09-10 HISTORY — DX: Major depressive disorder, single episode, severe without psychotic features: F32.2

## 2015-09-10 LAB — I-STAT VENOUS BLOOD GAS, ED
ACID-BASE EXCESS: 1 mmol/L (ref 0.0–2.0)
BICARBONATE: 21 meq/L (ref 20.0–24.0)
O2 Saturation: 53 %
PH VEN: 7.594 — AB (ref 7.250–7.300)
TCO2: 22 mmol/L (ref 0–100)
pCO2, Ven: 21.6 mmHg — ABNORMAL LOW (ref 45.0–50.0)
pO2, Ven: 22 mmHg — ABNORMAL LOW (ref 31.0–45.0)

## 2015-09-10 LAB — BASIC METABOLIC PANEL
ANION GAP: 14 (ref 5–15)
BUN: 9 mg/dL (ref 6–20)
CALCIUM: 9.1 mg/dL (ref 8.9–10.3)
CO2: 20 mmol/L — ABNORMAL LOW (ref 22–32)
Chloride: 102 mmol/L (ref 101–111)
Creatinine, Ser: 0.85 mg/dL (ref 0.44–1.00)
GFR calc Af Amer: >60 mL/min (ref >60)
GLUCOSE: 218 mg/dL — AB (ref 65–99)
POTASSIUM: 2.8 mmol/L — AB (ref 3.5–5.1)
SODIUM: 136 mmol/L (ref 135–145)

## 2015-09-10 LAB — CBG MONITORING, ED: GLUCOSE-CAPILLARY: 223 mg/dL — AB (ref 65–99)

## 2015-09-10 LAB — URINALYSIS, ROUTINE W REFLEX MICROSCOPIC
Bilirubin Urine: NEGATIVE
GLUCOSE, UA: 100 mg/dL — AB
Hgb urine dipstick: NEGATIVE
KETONES UR: 15 mg/dL — AB
LEUKOCYTES UA: NEGATIVE
NITRITE: NEGATIVE
PROTEIN: NEGATIVE mg/dL
Specific Gravity, Urine: 1.006 (ref 1.005–1.030)
pH: 7.5 (ref 5.0–8.0)

## 2015-09-10 LAB — PREGNANCY, URINE: PREG TEST UR: NEGATIVE

## 2015-09-10 MED ORDER — LORAZEPAM 2 MG/ML IJ SOLN
1.0000 mg | Freq: Once | INTRAMUSCULAR | Status: AC
  Administered 2015-09-10: 1 mg via INTRAVENOUS
  Filled 2015-09-10: qty 1

## 2015-09-10 MED ORDER — LACTATED RINGERS IV BOLUS (SEPSIS)
2000.0000 mL | Freq: Once | INTRAVENOUS | Status: AC
  Administered 2015-09-10: 2000 mL via INTRAVENOUS

## 2015-09-10 MED ORDER — POTASSIUM CHLORIDE CRYS ER 20 MEQ PO TBCR
80.0000 meq | EXTENDED_RELEASE_TABLET | Freq: Once | ORAL | Status: AC
  Administered 2015-09-10: 80 meq via ORAL
  Filled 2015-09-10: qty 4

## 2015-09-10 NOTE — ED Notes (Signed)
Pt is breathing rapidly,  States blood sugar is high

## 2015-09-10 NOTE — ED Provider Notes (Signed)
CSN: [id number]     Arrival date & time 09/10/15  2040 History  By signing my name below, I, [facility], attest that this documentation has been prepared under the direction and in the presence of Melene Plan, DO. Electronically Signed: Randell Patient, ED Scribe. 09/10/2015. 10:33 PM.   Chief Complaint  Patient presents with  . Diabetic complications     The history is provided by the patient. No language interpreter was used.   HPI Comments: LANDI BISCARDI is a 30 y.o. female with a hx of DM, chronic neutropenia, anxiety, and severe major depression who presents to the Emergency Department complaining of intermittent, gradually worsening SOB onset for the past 2 weeks. Pt states that she was seen by her endocrinologist where she had a normal check-up and has been compliant with all medications, exercising regularly, and eating healthy. She reports urinary frequency, abdominal pain with palpation, anxiety, and bilateral foot numbness. She has been drinking water. She notes similar symptoms in the past  Denies diarrhea, constipation, nausea, vomiting, fever, or any other symptoms currently.  Past Medical History  Diagnosis Date  . Diabetes mellitus without complication (HCC)   . Chronic benign neutropenia (HCC)   . Anxiety   . Severe major depression [facility])    Past Surgical History  Procedure Laterality Date  . Tonsillectomy    . Knee surgery    . Tonsillectomy and adenoidectomy     Family History  Problem Relation Age of Onset  . Diabetes Mellitus II Maternal Grandmother   . Diabetes Mellitus II Paternal Grandfather   . Leukemia Paternal Grandfather    Social History  Substance Use Topics  . Smoking status: Never Smoker   . Smokeless tobacco: Never Used  . Alcohol Use: No   OB History    No data available     Review of Systems  Constitutional: Negative for fever.  Respiratory: Positive for shortness of breath.   Gastrointestinal: Positive for abdominal  pain. Negative for nausea, vomiting, diarrhea and constipation.  Genitourinary: Positive for frequency.  Neurological: Negative for numbness.  Psychiatric/Behavioral: The patient is nervous/anxious.   All other systems reviewed and are negative.     Allergies  Paxil  Home Medications   Prior to Admission medications   Medication Sig Start Date End Date Taking? Authorizing Provider  ACCU-CHEK AVIVA PLUS test strip  11/13/14   Historical Provider, MD  atenolol (TENORMIN) 25 MG tablet Take 1 tablet (25 mg total) by mouth daily. 10/12/14   Thermon Leyland, NP  BD PEN NEEDLE NANO U/F 32G X 4 MM MISC U UTD TID FOR INJECTION 11/16/14   Historical Provider, MD  busPIRone (BUSPAR) 15 MG tablet Take 1 tablet (15 mg total) by mouth 2 (two) times daily. 05/20/15   Sheliah Hatch, MD  butalbital-acetaminophen-caffeine (FIORICET, ESGIC) [phone] MG tablet TAKE 1 TABLET BY MOUTH TWICE DAILY AS NEEDED HEADACHE 02/28/15   Sheliah Hatch, MD  cetirizine (ZYRTEC) 10 MG tablet Take 10 mg by mouth as needed for allergies.    Historical Provider, MD  clonazePAM (KLONOPIN) 1 MG tablet Take 1 tablet (1 mg total) by mouth 2 (two) times daily as needed. for anxiety 08/23/15   Sheliah Hatch, MD  doxepin (SINEQUAN) 10 MG capsule Take 1 capsule (10 mg total) by mouth at bedtime. 05/22/15   Sheliah Hatch, MD  GLUCAGEN HYPOKIT 1 MG SOLR injection Inject 1 application into the skin once. FOR HYPOGLYCEMIA 09/06/14   Historical Provider, MD  insulin aspart (NOVOLOG FLEXPEN) 100 UNIT/ML FlexPen Inject 0-9 Units into the skin 3 (three) times daily with meals. 1 unit per every 50 points over 100 Plus 1 unit for every 10 carbs 10/12/14   Thermon LeylandLaura A Davis, NP  LEVEMIR FLEXTOUCH 100 UNIT/ML Pen  11/20/14   Historical Provider, MD  mirtazapine (REMERON SOL-TAB) 15 MG disintegrating tablet Take 1 tablet (15 mg total) by mouth at bedtime. 06/10/15   Sheliah HatchKatherine E Tabori, MD  phenylephrine (SUDAFED PE) 10 MG TABS tablet Take 10 mg by  mouth every 4 (four) hours as needed (for sinus congestion).     Historical Provider, MD  sertraline (ZOLOFT) 100 MG tablet Take 1 tablet (100 mg total) by mouth daily. 05/20/15   Sheliah HatchKatherine E Tabori, MD  SUMAtriptan (IMITREX) 100 MG tablet TAKE 1/2 TABLET(50 MG) BY MOUTH EVERY 2 HOURS AS NEEDED FOR MIGRAINE. MAY REPEAT IN 2 HOURS IF HEADACHE PERSISTS OR RECURS 07/22/15   Sheliah HatchKatherine E Tabori, MD  traZODone (DESYREL) 50 MG tablet Take 0.5-1 tablets (25-50 mg total) by mouth at bedtime as needed for sleep. 05/20/15   Sheliah HatchKatherine E Tabori, MD   BP 108/68 mmHg  Pulse 70  Temp(Src) 97.6 F (36.4 C) (Oral)  Resp 20  Ht 5\' 5"  (1.651 m)  Wt 170 lb (77.111 kg)  BMI 28.29 kg/m2  SpO2 100% Physical Exam  Constitutional: She is oriented to person, place, and time. She appears well-developed and well-nourished. No distress.  HENT:  Head: Normocephalic and atraumatic.  Eyes: EOM are normal. Pupils are equal, round, and reactive to light.  Neck: Normal range of motion. Neck supple.  Cardiovascular: Normal rate and regular rhythm.  Exam reveals no gallop and no friction rub.   No murmur heard. Pulmonary/Chest: Effort normal. She has no wheezes. She has no rales.  Kussmaul breathing. Tachypnea. Clear lung sounds.  Abdominal: Soft. She exhibits no distension. There is generalized tenderness.  Diffuse abdominal tenderness.  Musculoskeletal: She exhibits no edema or tenderness.  No BLE edema.  Neurological: She is alert and oriented to person, place, and time.  Skin: Skin is warm and dry. She is not diaphoretic.  Psychiatric: She has a normal mood and affect. Her behavior is normal.  Nursing note and vitals reviewed.   ED Course  Procedures   DIAGNOSTIC STUDIES: Oxygen Saturation is 100% on RA, normal by my interpretation.    COORDINATION OF CARE: 9:36 PM Will order Ativan and labs. Discussed treatment plan with pt at bedside and pt agreed to plan.  10:40 PM Returned to discuss results of labs with  pt.  Labs Review Labs Reviewed  URINALYSIS, ROUTINE W REFLEX MICROSCOPIC (NOT AT [facility]) - Abnormal; Notable for the following:    Glucose, UA 100 (*)    Ketones, ur 15 (*)    All other components within normal limits  BASIC METABOLIC PANEL - Abnormal; Notable for the following:    Potassium 2.8 (*)    CO2 20 (*)    Glucose, Bld 218 (*)    All other components within normal limits  CBG MONITORING, ED - Abnormal; Notable for the following:    Glucose-Capillary 223 (*)    All other components within normal limits  I-STAT VENOUS BLOOD GAS, ED - Abnormal; Notable for the following:    pH, Ven 7.594 (*)    pCO2, Ven 21.6 (*)    pO2, Ven 22.0 (*)    All other components within normal limits  PREGNANCY, URINE    Imaging Review No  results found. I have personally reviewed and evaluated these images and lab results as part of my medical decision-making.   EKG Interpretation   Date/Time:  Tuesday September 10 2015 21:30:23 EDT Ventricular Rate:  86 PR Interval:    QRS Duration: 94 QT Interval:  442 QTC Calculation: 529 R Axis:   82 Text Interpretation:  Sinus rhythm RSR' in V1 or V2, probably normal  variant Prolonged QT interval No significant change since last tracing  Confirmed by Marshell Rieger MD, Reuel BoomANIEL ([id number]) on 09/10/2015 10:00:08 PM      MDM   Final diagnoses:  Hyperglycemia  Ketonuria  Tachypnea  Hypokalemia    30 yo F Who is having trouble controlling her blood sugars. She is concerned she is in DKA. She is hyperventilating. She is having some symptoms consistent with a panic attack. VBG with respiratory alkalosis. Laboratory evaluation patient is not in DKA. She is feeling much better after Ativan and 2 L of IV fluids. She has hypokalemia. Will treat her. Have her follow-up with her family physician and endocrinologist.  I personally performed the services described in this documentation, which was scribed in my presence. The recorded information has been reviewed and is  accurate.   11:19 PM:  I have discussed the diagnosis/risks/treatment options with the patient and family and believe the pt to be eligible for discharge home to follow-up with Endo, PCP. We also discussed returning to the ED immediately if new or worsening sx occur. We discussed the sx which are most concerning (e.g., sudden worsening pain, fever, inability to tolerate by mouth) that necessitate immediate return. Medications administered to the patient during their visit and any new prescriptions provided to the patient are listed below.  Medications given during this visit Medications  lactated ringers bolus 2,000 mL (2,000 mLs Intravenous New Bag/Given 09/10/15 2155)  LORazepam (ATIVAN) injection 1 mg (1 mg Intravenous Given 09/10/15 2202)  potassium chloride SA (K-DUR,KLOR-CON) CR tablet 80 mEq (80 mEq Oral Given 09/10/15 2253)  LORazepam (ATIVAN) injection 1 mg (1 mg Intravenous Given 09/10/15 2301)    New Prescriptions   No medications on file    The patient appears reasonably screen and/or stabilized for discharge and I doubt any other medical condition or other [facility] requiring further screening, evaluation, or treatment in the ED at this time prior to discharge.     Melene Planan Brandy Kabat, DO 09/10/15 2319

## 2015-09-10 NOTE — Discharge Instructions (Signed)
Hyperglycemia °High blood sugar (hyperglycemia) means that the level of sugar in your blood is higher than it should be. Signs of high blood sugar include: °· Feeling thirsty. °· Frequent peeing (urinating). °· Feeling tired or sleepy. °· Dry mouth. °· Vision changes. °· Feeling weak. °· Feeling hungry but losing weight. °· Numbness and tingling in your hands or feet. °· Headache. °When you ignore these signs, your blood sugar may keep going up. These problems may get worse, and other problems may begin. °HOME CARE °· Check your blood sugars as told by your doctor. Write down the numbers with the date and time. °· Take the right amount of insulin or diabetes pills at the right time. Write down the dose with date and time. °· Refill your insulin or diabetes pills before running out. °· Watch what you eat. Follow your meal plan. °· Drink liquids without sugar, such as water. Check with your doctor if you have kidney or heart disease. °· Follow your doctor's orders for exercise. Exercise at the same time of day. °· Keep your doctor's appointments. °GET HELP RIGHT AWAY IF:  °· You have trouble thinking or are confused. °· You have fast breathing with fruity smelling breath. °· You pass out (faint). °· You have 2 to 3 days of high blood sugars and you do not know why. °· You have chest pain. °· You are feeling sick to your stomach (nauseous) or throwing up (vomiting). °· You have sudden vision changes. °MAKE SURE YOU:  °· Understand these instructions. °· Will watch your condition. °· Will get help right away if you are not doing well or get worse. °  °This information is not intended to replace advice given to you by your health care provider. Make sure you discuss any questions you have with your health care provider. °  °Document Released: 12/28/2008 Document Revised: 03/23/2014 Document Reviewed: 11/06/2014 °Elsevier Interactive Patient Education ©2016 Elsevier Inc. ° °

## 2015-09-10 NOTE — ED Notes (Addendum)
SOB, fatigue, high blood sugars, polyuria, polydypsia x2 weeks. Sts she is type 1 DM and her sx have been getting worse.  Is hyperventilating.  Sts she is dizzy and vision blurry. CBG 223.  Has been checked by RT in triage.

## 2015-09-10 NOTE — ED Notes (Signed)
Pt is breathing easier and slower at present

## 2015-09-11 ENCOUNTER — Ambulatory Visit (INDEPENDENT_AMBULATORY_CARE_PROVIDER_SITE_OTHER): Payer: 59 | Admitting: Family Medicine

## 2015-09-11 ENCOUNTER — Telehealth: Payer: Self-pay | Admitting: Family Medicine

## 2015-09-11 ENCOUNTER — Encounter: Payer: Self-pay | Admitting: Family Medicine

## 2015-09-11 ENCOUNTER — Other Ambulatory Visit (INDEPENDENT_AMBULATORY_CARE_PROVIDER_SITE_OTHER): Payer: 59

## 2015-09-11 VITALS — BP 126/80 | HR 63 | Temp 98.3°F | Resp 17 | Ht 66.0 in | Wt 173.2 lb

## 2015-09-11 DIAGNOSIS — R1012 Left upper quadrant pain: Secondary | ICD-10-CM

## 2015-09-11 DIAGNOSIS — R112 Nausea with vomiting, unspecified: Secondary | ICD-10-CM

## 2015-09-11 MED ORDER — PROMETHAZINE HCL 25 MG/ML IJ SOLN
25.0000 mg | Freq: Once | INTRAMUSCULAR | Status: AC
  Administered 2015-09-11: 25 mg via INTRAMUSCULAR

## 2015-09-11 MED ORDER — CIPROFLOXACIN HCL 500 MG PO TABS: 500.0000 mg | ORAL_TABLET | Freq: Two times a day (BID) | ORAL | Status: DC

## 2015-09-11 MED ORDER — METRONIDAZOLE 500 MG PO TABS: 500.0000 mg | ORAL_TABLET | Freq: Three times a day (TID) | ORAL | Status: DC

## 2015-09-11 MED ORDER — ONDANSETRON 4 MG PO TBDP
4.0000 mg | ORAL_TABLET | Freq: Three times a day (TID) | ORAL | Status: DC | PRN
Start: 2015-09-11 — End: 2015-11-13

## 2015-09-11 NOTE — Telephone Encounter (Signed)
Ok to put pt in at 3pm if she can make it.

## 2015-09-11 NOTE — Assessment & Plan Note (Signed)
New.  Pt w/ TTP over L upper and lower quadrants.  + voluntary guarding.  Pt's pain is out of proportion to exam but she is young for diverticulitis.  To be on the safe side, will start Cipro/Flagyl for possible diverticulitis/bacterial enteritis.  Check labs as CBC was not done yesterday in ER.  R/o biliary abnormality, H pylori, electrolyte imbalance.  If labs unrevealing or no improvement on abx or sxs worsening- pt to go back to ER.  Reviewed supportive care and red flags that should prompt return.  Pt expressed understanding and is in agreement w/ plan.

## 2015-09-11 NOTE — Assessment & Plan Note (Signed)
New.  Pt was fearful that she was in DKA yesterday and went to the ER.  She was not in DKA but she continues to have abd pain, N/Vx2 today.  Pt is tolerating fluids but having difficulty tolerating food.  Phenergan given in office today and script for Zofran provided.  Reviewed supportive care and red flags that should prompt return.  Pt expressed understanding and is in agreement w/ plan.

## 2015-09-11 NOTE — Progress Notes (Signed)
Pre visit review using our clinic review tool, if applicable. No additional management support is needed unless otherwise documented below in the visit note. 

## 2015-09-11 NOTE — Telephone Encounter (Signed)
If pt is vomiting, she needs to be seen.  If there are no openings here today, she can be seen at another office

## 2015-09-11 NOTE — Patient Instructions (Signed)
Go to [facility] and get your labs done Start the Cipro and flagyl for presumed bacterial enteritis Bland diet- soups, jello, applesauce, toast- until pain is improving Drink plenty of fluids Use the Zofran as needed for nausea Alternate tylenol/ibuprofen for pain/headache REST! Call with any questions or concerns Hang in there!!!

## 2015-09-11 NOTE — Telephone Encounter (Signed)
Pt is coming in at 3:00 today.

## 2015-09-11 NOTE — Addendum Note (Signed)
Addended by: Geannie RisenBRODMERKEL, Edlin L on: 09/11/2015 03:57 PM   Modules accepted: Orders

## 2015-09-11 NOTE — Progress Notes (Signed)
   Subjective:    Patient ID: Michelle Riley, female    DOB: Apr 13, 1985, 30 y.o.   MRN: [id number]  HPI ER f/u- pt was seen 6/27 w/ SOB x2 weeks w/ urinary frequency, abd pain, anxiety, palpitations.  Pt was found to be hypokalemic w/ K+ of 2.8, glucose 223, ABG consistent w/ respiratory alkalosis.  ER dx'd her w/ panic attack, no DKA.  sxs improved after Ativan and IVF.  Pt was given K+ 80meq in ER.  Today pt reports she is dizzy w/ blurry vision.  + nausea, vomiting from dizziness.  Pt ate wheat roll and a little bit of soup- 'that's the only thing I've been able to keep down in 48 hrs'.  No diarrhea.  No fevers.  Pt reports she had dizziness and nausea at time of d/c from ER.  Dizziness started 24 hrs ago.  No known sick contacts.  Pt w/ diffuse abdominal pain.  + HA.   Review of Systems For ROS see HPI     Objective:   Physical Exam  Constitutional: She is oriented to person, place, and time. She appears well-developed and well-nourished.  Obviously uncomfortable  HENT:  Head: Normocephalic and atraumatic.  MMM TMs WNL  Eyes: Conjunctivae and EOM are normal. Pupils are equal, round, and reactive to light.  No nystagmus  Neck: Neck supple.  Cardiovascular: Normal rate, regular rhythm and intact distal pulses.   Pulmonary/Chest: Effort normal and breath sounds normal. No respiratory distress. She has no wheezes. She has no rales.  Abdominal: Soft. She exhibits no distension. There is tenderness (TTP over LUQ and LLQ). There is guarding (voluntary guarding). There is no rebound.  Hyperactive BS  Lymphadenopathy:    She has no cervical adenopathy.  Neurological: She is alert and oriented to person, place, and time. She has normal reflexes. No cranial nerve deficit. Coordination normal.  Skin: Skin is warm and dry.  Psychiatric:  Very dramatic in office today  Vitals reviewed.         Assessment & Plan:

## 2015-09-11 NOTE — Telephone Encounter (Signed)
Patient previously called and was scheduled an appt to see PCP tomorrow(09/12/15) for ED follow-up visit.  Patient wanted to be seen today if possible.  Returned call to patient per PCP and advised patient to consult with her endocrinologist if related to her diabetes.  However, PCP would see her tomorrow as scheduled.  Patient just called back to office and stated she is now vomitting and thinks something else is going on with her but no one is listening to her.  Patient wants to know what she should do not since she is now vomiting.  Requesting callback from NormanJessica.

## 2015-09-12 ENCOUNTER — Encounter: Payer: Self-pay | Admitting: Family Medicine

## 2015-09-12 ENCOUNTER — Emergency Department (HOSPITAL_BASED_OUTPATIENT_CLINIC_OR_DEPARTMENT_OTHER): Payer: 59

## 2015-09-12 ENCOUNTER — Emergency Department (HOSPITAL_BASED_OUTPATIENT_CLINIC_OR_DEPARTMENT_OTHER)
Admission: EM | Admit: 2015-09-12 | Discharge: 2015-09-12 | Disposition: A | Discharge status: Home / Self Care | Payer: 59 | Attending: Emergency Medicine | Admitting: Emergency Medicine

## 2015-09-12 ENCOUNTER — Encounter (HOSPITAL_BASED_OUTPATIENT_CLINIC_OR_DEPARTMENT_OTHER): Payer: Self-pay | Admitting: *Deleted

## 2015-09-12 ENCOUNTER — Ambulatory Visit: Payer: 59 | Admitting: Family Medicine

## 2015-09-12 DIAGNOSIS — R1012 Left upper quadrant pain: Secondary | ICD-10-CM | POA: Diagnosis present

## 2015-09-12 DIAGNOSIS — Z794 Long term (current) use of insulin: Secondary | ICD-10-CM | POA: Diagnosis not present

## 2015-09-12 DIAGNOSIS — E119 Type 2 diabetes mellitus without complications: Secondary | ICD-10-CM | POA: Insufficient documentation

## 2015-09-12 LAB — BASIC METABOLIC PANEL
BUN: 8 mg/dL (ref 6–23)
CO2: 30 mEq/L (ref 19–32)
Calcium: 9.1 mg/dL (ref 8.4–10.5)
Chloride: 106 mEq/L (ref 96–112)
Creatinine, Ser: 0.77 mg/dL (ref 0.40–1.20)
GFR: 93.22 mL/min (ref >60.00)
GLUCOSE: 133 mg/dL — AB (ref 70–99)
Potassium: 4.7 mEq/L (ref 3.5–5.1)
Sodium: 140 mEq/L (ref 135–145)

## 2015-09-12 LAB — LIPASE, BLOOD: Lipase: 10 U/L — ABNORMAL LOW (ref 11–51)

## 2015-09-12 LAB — CBC WITH DIFFERENTIAL/PLATELET
BASOS PCT: 1 %
Basophils Absolute: 0.2 10*3/uL — ABNORMAL HIGH (ref 0.0–0.1)
Basophils Absolute: 0 10*3/uL (ref 0.0–0.1)
Basophils Relative: 2.5 % (ref 0.0–3.0)
EOS ABS: 0.1 10*3/uL (ref 0.0–0.7)
EOS ABS: 0.1 10*3/uL (ref 0.0–0.7)
EOS PCT: 1.4 % (ref 0.0–5.0)
Eosinophils Relative: 1 %
HCT: 34.7 % — ABNORMAL LOW (ref 36.0–46.0)
HEMATOCRIT: 35 % — AB (ref 36.0–46.0)
HEMOGLOBIN: 11.6 g/dL — AB (ref 12.0–15.0)
HEMOGLOBIN: 11.4 g/dL — AB (ref 12.0–15.0)
LYMPHS PCT: 37.8 % (ref 12.0–46.0)
Lymphocytes Relative: 37 %
Lymphs Abs: 2.3 10*3/uL (ref 0.7–4.0)
Lymphs Abs: 1.6 10*3/uL (ref 0.7–4.0)
MCH: 30.7 pg (ref 26.0–34.0)
MCHC: 33 g/dL (ref 30.0–36.0)
MCHC: 32.9 g/dL (ref 30.0–36.0)
MCV: 90.6 fl (ref 78.0–100.0)
MCV: 93.5 fL (ref 78.0–100.0)
MONO ABS: 0.4 10*3/uL (ref 0.1–1.0)
MONOS PCT: 8 %
Monocytes Absolute: 0.4 10*3/uL (ref 0.1–1.0)
Monocytes Relative: 7.1 % (ref 3.0–12.0)
NEUTROS PCT: 53 %
Neutro Abs: 3.1 10*3/uL (ref 1.4–7.7)
Neutro Abs: 2.2 10*3/uL (ref 1.7–7.7)
Neutrophils Relative %: 51.2 % (ref 43.0–77.0)
PLATELETS: 152 10*3/uL (ref 150–400)
Platelets: 170 10*3/uL (ref 150.0–400.0)
RBC: 3.87 Mil/uL (ref 3.87–5.11)
RBC: 3.71 MIL/uL — ABNORMAL LOW (ref 3.87–5.11)
RDW: 13 % (ref 11.5–15.5)
RDW: 12.3 % (ref 11.5–15.5)
WBC: 6 10*3/uL (ref 4.0–10.5)
WBC: 4.3 10*3/uL (ref 4.0–10.5)

## 2015-09-12 LAB — COMPREHENSIVE METABOLIC PANEL
ALK PHOS: 44 U/L (ref 38–126)
ALT: 15 U/L (ref 14–54)
AST: 20 U/L (ref 15–41)
Albumin: 3.7 g/dL (ref 3.5–5.0)
Anion gap: 9 (ref 5–15)
BUN: 7 mg/dL (ref 6–20)
CALCIUM: 8.5 mg/dL — AB (ref 8.9–10.3)
CO2: 27 mmol/L (ref 22–32)
CREATININE: 0.77 mg/dL (ref 0.44–1.00)
Chloride: 106 mmol/L (ref 101–111)
Glucose, Bld: 84 mg/dL (ref 65–99)
Potassium: 3.7 mmol/L (ref 3.5–5.1)
Sodium: 142 mmol/L (ref 135–145)
Total Bilirubin: 0.3 mg/dL (ref 0.3–1.2)
Total Protein: 6.7 g/dL (ref 6.5–8.1)

## 2015-09-12 LAB — URINALYSIS, ROUTINE W REFLEX MICROSCOPIC
BILIRUBIN URINE: NEGATIVE
Glucose, UA: NEGATIVE mg/dL
HGB URINE DIPSTICK: NEGATIVE
KETONES UR: NEGATIVE mg/dL
Leukocytes, UA: NEGATIVE
NITRITE: NEGATIVE
PROTEIN: NEGATIVE mg/dL
Specific Gravity, Urine: 1.009 (ref 1.005–1.030)
pH: 7.5 (ref 5.0–8.0)

## 2015-09-12 LAB — LIPASE: Lipase: 16 U/L (ref 11.0–59.0)

## 2015-09-12 LAB — HEPATIC FUNCTION PANEL
ALBUMIN: 4.1 g/dL (ref 3.5–5.2)
ALT: 12 U/L (ref 0–35)
AST: 18 U/L (ref 0–37)
Alkaline Phosphatase: 46 U/L (ref 39–117)
BILIRUBIN TOTAL: 0.3 mg/dL (ref 0.2–1.2)
Bilirubin, Direct: 0.1 mg/dL (ref 0.0–0.3)
Total Protein: 7.1 g/dL (ref 6.0–8.3)

## 2015-09-12 LAB — AMYLASE: AMYLASE: 22 U/L — AB (ref 27–131)

## 2015-09-12 LAB — CBG MONITORING, ED: GLUCOSE-CAPILLARY: 102 mg/dL — AB (ref 65–99)

## 2015-09-12 LAB — PREGNANCY, URINE: PREG TEST UR: NEGATIVE

## 2015-09-12 LAB — H. PYLORI ANTIBODY, IGG: H PYLORI IGG: NEGATIVE

## 2015-09-12 MED ORDER — METOCLOPRAMIDE HCL 5 MG/ML IJ SOLN
10.0000 mg | Freq: Once | INTRAMUSCULAR | Status: AC
  Administered 2015-09-12: 10 mg via INTRAVENOUS
  Filled 2015-09-12: qty 2

## 2015-09-12 MED ORDER — MORPHINE SULFATE (PF) 4 MG/ML IV SOLN
4.0000 mg | Freq: Once | INTRAVENOUS | Status: AC
  Administered 2015-09-12: 4 mg via INTRAVENOUS
  Filled 2015-09-12: qty 1

## 2015-09-12 MED ORDER — DIAZEPAM 5 MG/ML IJ SOLN: 5.0000 mg | Freq: Once | INTRAMUSCULAR | Status: DC

## 2015-09-12 MED ORDER — OMEPRAZOLE 20 MG PO CPDR: 20.0000 mg | DELAYED_RELEASE_CAPSULE | Freq: Every day | ORAL | Status: DC

## 2015-09-12 MED ORDER — IOPAMIDOL (ISOVUE-300) INJECTION 61%
100.0000 mL | Freq: Once | INTRAVENOUS | Status: AC | PRN
  Administered 2015-09-12: 100 mL via INTRAVENOUS

## 2015-09-12 MED ORDER — SODIUM CHLORIDE 0.9 % IV BOLUS (SEPSIS)
1000.0000 mL | Freq: Once | INTRAVENOUS | Status: AC
  Administered 2015-09-12: 1000 mL via INTRAVENOUS

## 2015-09-12 MED ORDER — SUCRALFATE 1 G PO TABS: 1.0000 g | ORAL_TABLET | Freq: Three times a day (TID) | ORAL | Status: DC

## 2015-09-12 NOTE — ED Notes (Signed)
Patient transported to CT 

## 2015-09-12 NOTE — ED Provider Notes (Signed)
CSN: [id number]     Arrival date & time 09/12/15  1544 History   First MD Initiated Contact with Patient 09/12/15 1702     Chief Complaint  Patient presents with  . Abdominal Pain     (Consider location/radiation/quality/duration/timing/severity/associated sxs/prior Treatment) HPI Michelle Riley is a 30 y.o. female with PMH significant for DM, chronic benign neutropenia, anxiety, and severe major depression who presents with 2 day history of gradual onset, constant, worsening, non-radiating, sharp LUQ abdominal pain. Associated symptoms include N/V.  Denies fever, chills, cough, diarrhea, constipation, or urinary symptoms.  Last BM normal, yesterday.  Patient reports she was driving when the pain started.  She saw her PCP yesterday, had normal labs, started on Cipro, Flagyl, and Zofran.  Patient has been taking these medications accordingly.  She has been following a bland diet. No vomiting today, just persistent nausea.  No aggravating factors.   Past Medical History  Diagnosis Date  . Diabetes mellitus without complication (HCC)   . Chronic benign neutropenia (HCC)   . Anxiety   . Severe major depression [facility](HCC)    Past Surgical History  Procedure Laterality Date  . Tonsillectomy    . Knee surgery    . Tonsillectomy and adenoidectomy     Family History  Problem Relation Age of Onset  . Diabetes Mellitus II Maternal Grandmother   . Diabetes Mellitus II Paternal Grandfather   . Leukemia Paternal Grandfather    Social History  Substance Use Topics  . Smoking status: Never Smoker   . Smokeless tobacco: Never Used  . Alcohol Use: No   OB History    No data available     Review of Systems All other systems negative unless otherwise stated in HPI    Allergies  Paxil  Home Medications   Prior to Admission medications   Medication Sig Start Date End Date Taking? Authorizing Provider  ACCU-CHEK AVIVA PLUS test strip  11/13/14   Historical Provider, MD  atenolol  (TENORMIN) 25 MG tablet Take 1 tablet (25 mg total) by mouth daily. 10/12/14   Thermon LeylandLaura A Davis, NP  BD PEN NEEDLE NANO U/F 32G X 4 MM MISC U UTD TID FOR INJECTION 11/16/14   Historical Provider, MD  busPIRone (BUSPAR) 15 MG tablet Take 1 tablet (15 mg total) by mouth 2 (two) times daily. 05/20/15   Sheliah HatchKatherine E Tabori, MD  butalbital-acetaminophen-caffeine (FIORICET, ESGIC) [phone] MG tablet TAKE 1 TABLET BY MOUTH TWICE DAILY AS NEEDED HEADACHE 02/28/15   Sheliah HatchKatherine E Tabori, MD  cetirizine (ZYRTEC) 10 MG tablet Take 10 mg by mouth as needed for allergies.    Historical Provider, MD  ciprofloxacin (CIPRO) 500 MG tablet Take 1 tablet (500 mg total) by mouth 2 (two) times daily. 09/11/15   Sheliah HatchKatherine E Tabori, MD  clonazePAM (KLONOPIN) 1 MG tablet Take 1 tablet (1 mg total) by mouth 2 (two) times daily as needed. for anxiety 08/23/15   Sheliah HatchKatherine E Tabori, MD  doxepin (SINEQUAN) 10 MG capsule Take 1 capsule (10 mg total) by mouth at bedtime. 05/22/15   Sheliah HatchKatherine E Tabori, MD  GLUCAGEN HYPOKIT 1 MG SOLR injection Inject 1 application into the skin once. FOR HYPOGLYCEMIA 09/06/14   Historical Provider, MD  insulin aspart (NOVOLOG FLEXPEN) 100 UNIT/ML FlexPen Inject 0-9 Units into the skin 3 (three) times daily with meals. 1 unit per every 50 points over 100 Plus 1 unit for every 10 carbs 10/12/14   Thermon LeylandLaura A Davis, NP  LEVEMIR FLEXTOUCH 100 UNIT/ML Pen  11/20/14   Historical Provider, MD  metroNIDAZOLE (FLAGYL) 500 MG tablet Take 1 tablet (500 mg total) by mouth 3 (three) times daily. 09/11/15   Sheliah Hatch, MD  mirtazapine (REMERON SOL-TAB) 15 MG disintegrating tablet Take 1 tablet (15 mg total) by mouth at bedtime. 06/10/15   Sheliah Hatch, MD  omeprazole (PRILOSEC) 20 MG capsule Take 1 capsule (20 mg total) by mouth daily. 09/12/15   Cheri Fowler, PA-C  ondansetron (ZOFRAN ODT) 4 MG disintegrating tablet Take 1 tablet (4 mg total) by mouth every 8 (eight) hours as needed for nausea or vomiting. 09/11/15   Sheliah Hatch, MD  phenylephrine (SUDAFED PE) 10 MG TABS tablet Take 10 mg by mouth every 4 (four) hours as needed (for sinus congestion).     Historical Provider, MD  sertraline (ZOLOFT) 100 MG tablet Take 1 tablet (100 mg total) by mouth daily. 05/20/15   Sheliah Hatch, MD  sucralfate (CARAFATE) 1 g tablet Take 1 tablet (1 g total) by mouth 4 (four) times daily -  with meals and at bedtime. 09/12/15   Cheri Fowler, PA-C  SUMAtriptan (IMITREX) 100 MG tablet TAKE 1/2 TABLET(50 MG) BY MOUTH EVERY 2 HOURS AS NEEDED FOR MIGRAINE. MAY REPEAT IN 2 HOURS IF HEADACHE PERSISTS OR RECURS 07/22/15   Sheliah Hatch, MD  traZODone (DESYREL) 50 MG tablet Take 0.5-1 tablets (25-50 mg total) by mouth at bedtime as needed for sleep. 05/20/15   Sheliah Hatch, MD   BP 105/60 mmHg  Pulse 69  Temp(Src) 98.6 F (37 C) (Oral)  Resp 18  Ht  (1.651 m)  Wt 78.472 kg  BMI 28.79 kg/m2  SpO2 99% Physical Exam  Constitutional: She is oriented to person, place, and time. She appears well-developed and well-nourished.  Non-toxic appearance. She does not have a sickly appearance. She does not appear ill.  HENT:  Head: Normocephalic and atraumatic.  Mouth/Throat: Oropharynx is clear and moist.  Eyes: Conjunctivae are normal. Pupils are equal, round, and reactive to light.  Neck: Normal range of motion. Neck supple.  Cardiovascular: Normal rate and regular rhythm.   Pulmonary/Chest: Effort normal and breath sounds normal. No accessory muscle usage or stridor. No respiratory distress. She has no wheezes. She has no rhonchi. She has no rales.  Abdominal: Soft. Bowel sounds are normal. She exhibits no distension. There is tenderness in the left upper quadrant and left lower quadrant. There is guarding (voluntary ). There is no rigidity, no rebound and no CVA tenderness.  Musculoskeletal: Normal range of motion.  Lymphadenopathy:    She has no cervical adenopathy.  Neurological: She is alert and oriented to person,  place, and time.  Speech clear without dysarthria.  Skin: Skin is warm and dry.  Psychiatric: She has a normal mood and affect. Her behavior is normal.    ED Course  Procedures (including critical care time) Labs Review Labs Reviewed  LIPASE, BLOOD - Abnormal; Notable for the following:    Lipase 10 (*)    All other components within normal limits  COMPREHENSIVE METABOLIC PANEL - Abnormal; Notable for the following:    Calcium 8.5 (*)    All other components within normal limits  CBC WITH DIFFERENTIAL/PLATELET - Abnormal; Notable for the following:    RBC 3.71 (*)    Hemoglobin 11.4 (*)    HCT 34.7 (*)    All other components within normal limits  CBG MONITORING, ED - Abnormal; Notable for the following:  Glucose-Capillary 102 (*)    All other components within normal limits  PREGNANCY, URINE  URINALYSIS, ROUTINE W REFLEX MICROSCOPIC (NOT AT [facility])    Imaging Review Ct Abdomen Pelvis W Contrast  09/12/2015  CLINICAL DATA:  Left upper quadrant pain for 2 days. EXAM: CT ABDOMEN AND PELVIS WITH CONTRAST TECHNIQUE: Multidetector CT imaging of the abdomen and pelvis was performed using the standard protocol following bolus administration of intravenous contrast. CONTRAST:  100mL ISOVUE-300 IOPAMIDOL (ISOVUE-300) INJECTION 61% COMPARISON:  None. FINDINGS: Lower chest:  Lung bases are clear.  Normal heart size. Hepatobiliary: Normal liver.  Normal gallbladder. Pancreas: Normal. Spleen: Normal. Adrenals/Urinary Tract: Normal adrenal glands. Normal kidneys. Normal bladder. Stomach/Bowel: No bowel wall thickening or dilatation. No pneumatosis, pneumoperitoneum or portal venous gas. No abdominal or pelvic free fluid. Normal appendix. Vascular/Lymphatic: Normal abdominal aorta.  No lymphadenopathy. Reproductive: Normal uterus. Intrauterine device in satisfactory position. No adnexal mass. Other: No fluid collection or hematoma. Musculoskeletal: No lytic or sclerotic osseous lesion. No acute  osseous abnormality. IMPRESSION: 1. No acute abdominal or pelvic pathology. Electronically Signed   By: Elige KoHetal  Patel   On: 09/12/2015 18:41   I have personally reviewed and evaluated these images and lab results as part of my medical decision-making.   EKG Interpretation None      MDM   Final diagnoses:  LUQ pain   Patient presents with LUQ abdominal pain and N/V x 2 days.  No diarrhea.  Taking Zofran, Cipro, and Flagyl without relief.  No vomiting today.  VSS, NAD.  On exam, patient appears well, non-toxic or septic.  Heart RRR, lungs CTAB, abdomen soft with tenderness in LUQ/LLQ with voluntary guarding.  No rebound or rigidity.  No distention.  UA obtained in triage negative for infection.  She is not pregnant.  Will obtain CBC, CMP, lipase, give IVF, Reglan, Morphine, and obtain CT abdomen/pelvis for further evaluation.  Labs without acute abnormalities. CT scan without acute abnormalities.  Possible gastritis? Discharge home with carafate and omeprazole.  Tylenol for pain.  Avoid coffee, alcohol, NSAIDs.  Follow up PCP.  Discussed return precautions.  Patient agrees and acknowledges the above plan for discharge.      Cheri FowlerKayla Stefano Trulson, PA-C 09/12/15 1903  Jacalyn LefevreJulie Haviland, MD 09/12/15 2238

## 2015-09-12 NOTE — ED Notes (Signed)
Pt verbalizes understanding of d/c instructions and denies any further needs at this time. 

## 2015-09-12 NOTE — ED Notes (Signed)
Pt reports LUQ pain since yesterday. Pt seen at PCP yesterday, reports labs taken and were normal. N/V yesterday. Describes pain as sharp and non radiating.

## 2015-09-12 NOTE — ED Notes (Signed)
Abdominal pain. She was seen by her MD yesterday and had a normal exam. Nausea. Left quadrant pain.

## 2015-09-12 NOTE — Telephone Encounter (Signed)
Mom has also called about this and asking where to take pt, not wanting to go to ER due to high deductible insurance plan and asking if an urgent care would work.

## 2015-09-12 NOTE — Discharge Instructions (Signed)
Your work up today included labs and CT scan of your abdomen and pelvis.  It has not revealed any acute or emergent cause for your pain.  It could be due to gastritis.  Stop taking Cipro and Flagyl.  Start taking Carafate and Omeprazole.  Take Tylenol for pain. Avoid NSAIDs (Ibuprofen, Advil), coffee, alcohol.  Follow up with your primary care physician in 1 week.    Abdominal Pain, Adult Many things can cause abdominal pain. Usually, abdominal pain is not caused by a disease and will improve without treatment. It can often be observed and treated at home. Your health care provider will do a physical exam and possibly order blood tests and X-rays to help determine the seriousness of your pain. However, in many cases, more time must pass before a clear cause of the pain can be found. Before that point, your health care provider may not know if you need more testing or further treatment. HOME CARE INSTRUCTIONS Monitor your abdominal pain for any changes. The following actions may help to alleviate any discomfort you are experiencing:  Only take over-the-counter or prescription medicines as directed by your health care provider.  Do not take laxatives unless directed to do so by your health care provider.  Try a clear liquid diet (broth, tea, or water) as directed by your health care provider. Slowly move to a bland diet as tolerated. SEEK MEDICAL CARE IF:  You have unexplained abdominal pain.  You have abdominal pain associated with nausea or diarrhea.  You have pain when you urinate or have a bowel movement.  You experience abdominal pain that wakes you in the night.  You have abdominal pain that is worsened or improved by eating food.  You have abdominal pain that is worsened with eating fatty foods.  You have a fever. SEEK IMMEDIATE MEDICAL CARE IF:  Your pain does not go away within 2 hours.  You keep throwing up (vomiting).  Your pain is felt only in portions of the abdomen, such  as the right side or the left lower portion of the abdomen.  You pass bloody or black tarry stools. MAKE SURE YOU:  Understand these instructions.  Will watch your condition.  Will get help right away if you are not doing well or get worse.   This information is not intended to replace advice given to you by your health care provider. Make sure you discuss any questions you have with your health care provider.   Document Released: 12/10/2004 Document Revised: 11/21/2014 Document Reviewed: 11/09/2012 Elsevier Interactive Patient Education [employer] Elsevier Inc.

## 2015-09-16 LAB — BASIC METABOLIC PANEL
BUN: 9 mg/dL (ref 4–21)
CREATININE: 0.8 mg/dL (ref 0.5–1.1)
Glucose: 163 mg/dL
SODIUM: 140 mmol/L (ref 137–147)

## 2015-09-16 LAB — HEMOGLOBIN A1C: HEMOGLOBIN A1C: 7.8

## 2015-09-16 LAB — HEPATIC FUNCTION PANEL
BILIRUBIN DIRECT: 0.06 mg/dL (ref 0.01–0.4)
BILIRUBIN, TOTAL: 0.2 mg/dL

## 2015-09-17 ENCOUNTER — Encounter (HOSPITAL_COMMUNITY): Payer: Self-pay | Admitting: Vascular Surgery

## 2015-09-17 ENCOUNTER — Emergency Department (HOSPITAL_COMMUNITY)
Admission: EM | Admit: 2015-09-17 | Discharge: 2015-09-17 | Disposition: A | Discharge status: Home / Self Care | Payer: 59 | Attending: Emergency Medicine | Admitting: Emergency Medicine

## 2015-09-17 ENCOUNTER — Emergency Department (HOSPITAL_COMMUNITY): Payer: 59

## 2015-09-17 DIAGNOSIS — Z794 Long term (current) use of insulin: Secondary | ICD-10-CM | POA: Diagnosis not present

## 2015-09-17 DIAGNOSIS — Z79899 Other long term (current) drug therapy: Secondary | ICD-10-CM | POA: Diagnosis not present

## 2015-09-17 DIAGNOSIS — E1143 Type 2 diabetes mellitus with diabetic autonomic (poly)neuropathy: Secondary | ICD-10-CM | POA: Diagnosis not present

## 2015-09-17 DIAGNOSIS — K3184 Gastroparesis: Secondary | ICD-10-CM

## 2015-09-17 DIAGNOSIS — R1011 Right upper quadrant pain: Secondary | ICD-10-CM | POA: Diagnosis not present

## 2015-09-17 DIAGNOSIS — R1012 Left upper quadrant pain: Secondary | ICD-10-CM | POA: Diagnosis present

## 2015-09-17 LAB — COMPREHENSIVE METABOLIC PANEL
ALT: 19 U/L (ref 14–54)
ANION GAP: 7 (ref 5–15)
AST: 21 U/L (ref 15–41)
Albumin: 3.8 g/dL (ref 3.5–5.0)
Alkaline Phosphatase: 43 U/L (ref 38–126)
BILIRUBIN TOTAL: 0.4 mg/dL (ref 0.3–1.2)
BUN: 7 mg/dL (ref 6–20)
CO2: 26 mmol/L (ref 22–32)
Calcium: 8.9 mg/dL (ref 8.9–10.3)
Chloride: 104 mmol/L (ref 101–111)
Creatinine, Ser: 0.73 mg/dL (ref 0.44–1.00)
Glucose, Bld: 142 mg/dL — ABNORMAL HIGH (ref 65–99)
POTASSIUM: 3.7 mmol/L (ref 3.5–5.1)
Sodium: 137 mmol/L (ref 135–145)
TOTAL PROTEIN: 6.8 g/dL (ref 6.5–8.1)

## 2015-09-17 LAB — URINALYSIS, ROUTINE W REFLEX MICROSCOPIC
BILIRUBIN URINE: NEGATIVE
Glucose, UA: NEGATIVE mg/dL
Hgb urine dipstick: NEGATIVE
KETONES UR: NEGATIVE mg/dL
LEUKOCYTES UA: NEGATIVE
NITRITE: NEGATIVE
PROTEIN: NEGATIVE mg/dL
Specific Gravity, Urine: 1.016 (ref 1.005–1.030)
pH: 7.5 (ref 5.0–8.0)

## 2015-09-17 LAB — CBC
HEMATOCRIT: 36.9 % (ref 36.0–46.0)
Hemoglobin: 11.9 g/dL — ABNORMAL LOW (ref 12.0–15.0)
MCH: 29.3 pg (ref 26.0–34.0)
MCHC: 32.2 g/dL (ref 30.0–36.0)
MCV: 90.9 fL (ref 78.0–100.0)
PLATELETS: 196 10*3/uL (ref 150–400)
RBC: 4.06 MIL/uL (ref 3.87–5.11)
RDW: 12.4 % (ref 11.5–15.5)
WBC: 4.9 10*3/uL (ref 4.0–10.5)

## 2015-09-17 LAB — LIPASE, BLOOD: Lipase: 22 U/L (ref 11–51)

## 2015-09-17 MED ORDER — SUMATRIPTAN SUCCINATE 100 MG PO TABS
100.0000 mg | ORAL_TABLET | Freq: Once | ORAL | Status: AC
  Administered 2015-09-17: 100 mg via ORAL
  Filled 2015-09-17: qty 1

## 2015-09-17 MED ORDER — SODIUM CHLORIDE 0.9 % IV BOLUS (SEPSIS)
1000.0000 mL | Freq: Once | INTRAVENOUS | Status: AC
  Administered 2015-09-17: 1000 mL via INTRAVENOUS

## 2015-09-17 MED ORDER — MORPHINE SULFATE (PF) 4 MG/ML IV SOLN
4.0000 mg | Freq: Once | INTRAVENOUS | Status: AC
  Administered 2015-09-17: 4 mg via INTRAVENOUS
  Filled 2015-09-17: qty 1

## 2015-09-17 MED ORDER — METOCLOPRAMIDE HCL 10 MG PO TABS: 10.0000 mg | ORAL_TABLET | Freq: Four times a day (QID) | ORAL | Status: DC

## 2015-09-17 MED ORDER — PROMETHAZINE HCL 12.5 MG PO TABS: 25.0000 mg | ORAL_TABLET | Freq: Three times a day (TID) | ORAL | Status: DC | PRN

## 2015-09-17 MED ORDER — PROMETHAZINE HCL 25 MG/ML IJ SOLN
12.5000 mg | Freq: Once | INTRAMUSCULAR | Status: DC
  Filled 2015-09-17: qty 1

## 2015-09-17 MED ORDER — METOCLOPRAMIDE HCL 5 MG/ML IJ SOLN
10.0000 mg | Freq: Once | INTRAMUSCULAR | Status: AC
  Administered 2015-09-17: 10 mg via INTRAVENOUS
  Filled 2015-09-17: qty 2

## 2015-09-17 NOTE — ED Provider Notes (Signed)
CSN: [id number]     Arrival date & time 09/17/15  4098 History   First MD Initiated Contact with Patient 09/17/15 [phone]     Chief Complaint  Patient presents with  . Abdominal Pain   (Consider location/radiation/quality/duration/timing/severity/associated sxs/prior Treatment) HPI 30 y.o. female with a hx of DM, Anxiety, presents to the Emergency Department today complaining of LUQ abdominal pain for several weeks. Seen in ED x 3 for similar complaint. PT was in ED on 09-12-15 most recently with unremarkable CT and lab work. Saw Endocrinologist yesterday who states concern for pancreatitis. Pt states pain is isolated to LUQ and is sharp/stabbing in nature. Has been constant with no relief. Pain is 9/10. OTC medications without relief. Rx of Protonix and Carafate w/o relief. Notes N/V. No diarrhea. No CP/SOB. No fevers. No vaginal bleeding/discharge. No other symptoms noted.   Past Medical History  Diagnosis Date  . Diabetes mellitus without complication (HCC)   . Chronic benign neutropenia (HCC)   . Anxiety   . Severe major depression [facility])    Past Surgical History  Procedure Laterality Date  . Tonsillectomy    . Knee surgery    . Tonsillectomy and adenoidectomy     Family History  Problem Relation Age of Onset  . Diabetes Mellitus II Maternal Grandmother   . Diabetes Mellitus II Paternal Grandfather   . Leukemia Paternal Grandfather    Social History  Substance Use Topics  . Smoking status: Never Smoker   . Smokeless tobacco: Never Used  . Alcohol Use: No   OB History    No data available     Review of Systems ROS reviewed and all are negative for acute change except as noted in the HPI.  Allergies  Paxil  Home Medications   Prior to Admission medications   Medication Sig Start Date End Date Taking? Authorizing Provider  ACCU-CHEK AVIVA PLUS test strip  11/13/14   Historical Provider, MD  atenolol (TENORMIN) 25 MG tablet Take 1 tablet (25 mg total) by mouth daily.  10/12/14   Thermon Leyland, NP  BD PEN NEEDLE NANO U/F 32G X 4 MM MISC U UTD TID FOR INJECTION 11/16/14   Historical Provider, MD  busPIRone (BUSPAR) 15 MG tablet Take 1 tablet (15 mg total) by mouth 2 (two) times daily. 05/20/15   Sheliah Hatch, MD  butalbital-acetaminophen-caffeine (FIORICET, ESGIC) [phone] MG tablet TAKE 1 TABLET BY MOUTH TWICE DAILY AS NEEDED HEADACHE 02/28/15   Sheliah Hatch, MD  cetirizine (ZYRTEC) 10 MG tablet Take 10 mg by mouth as needed for allergies.    Historical Provider, MD  ciprofloxacin (CIPRO) 500 MG tablet Take 1 tablet (500 mg total) by mouth 2 (two) times daily. 09/11/15   Sheliah Hatch, MD  clonazePAM (KLONOPIN) 1 MG tablet Take 1 tablet (1 mg total) by mouth 2 (two) times daily as needed. for anxiety 08/23/15   Sheliah Hatch, MD  doxepin (SINEQUAN) 10 MG capsule Take 1 capsule (10 mg total) by mouth at bedtime. 05/22/15   Sheliah Hatch, MD  GLUCAGEN HYPOKIT 1 MG SOLR injection Inject 1 application into the skin once. FOR HYPOGLYCEMIA 09/06/14   Historical Provider, MD  insulin aspart (NOVOLOG FLEXPEN) 100 UNIT/ML FlexPen Inject 0-9 Units into the skin 3 (three) times daily with meals. 1 unit per every 50 points over 100 Plus 1 unit for every 10 carbs 10/12/14   Thermon Leyland, NP  LEVEMIR FLEXTOUCH 100 UNIT/ML Pen  11/20/14   Historical  Provider, MD  metroNIDAZOLE (FLAGYL) 500 MG tablet Take 1 tablet (500 mg total) by mouth 3 (three) times daily. 09/11/15   Sheliah HatchKatherine E Tabori, MD  mirtazapine (REMERON SOL-TAB) 15 MG disintegrating tablet Take 1 tablet (15 mg total) by mouth at bedtime. 06/10/15   Sheliah HatchKatherine E Tabori, MD  omeprazole (PRILOSEC) 20 MG capsule Take 1 capsule (20 mg total) by mouth daily. 09/12/15   Cheri FowlerKayla Rose, PA-C  ondansetron (ZOFRAN ODT) 4 MG disintegrating tablet Take 1 tablet (4 mg total) by mouth every 8 (eight) hours as needed for nausea or vomiting. 09/11/15   Sheliah HatchKatherine E Tabori, MD  phenylephrine (SUDAFED PE) 10 MG TABS tablet Take 10  mg by mouth every 4 (four) hours as needed (for sinus congestion).     Historical Provider, MD  sertraline (ZOLOFT) 100 MG tablet Take 1 tablet (100 mg total) by mouth daily. 05/20/15   Sheliah HatchKatherine E Tabori, MD  sucralfate (CARAFATE) 1 g tablet Take 1 tablet (1 g total) by mouth 4 (four) times daily -  with meals and at bedtime. 09/12/15   Cheri FowlerKayla Rose, PA-C  SUMAtriptan (IMITREX) 100 MG tablet TAKE 1/2 TABLET(50 MG) BY MOUTH EVERY 2 HOURS AS NEEDED FOR MIGRAINE. MAY REPEAT IN 2 HOURS IF HEADACHE PERSISTS OR RECURS 07/22/15   Sheliah HatchKatherine E Tabori, MD  traZODone (DESYREL) 50 MG tablet Take 0.5-1 tablets (25-50 mg total) by mouth at bedtime as needed for sleep. 05/20/15   Sheliah HatchKatherine E Tabori, MD   BP 119/83 mmHg  Pulse 83  Resp 16  SpO2 97%   Physical Exam  Constitutional: She is oriented to person, place, and time. She appears well-developed and well-nourished.  HENT:  Head: Normocephalic and atraumatic.  Eyes: EOM are normal. Pupils are equal, round, and reactive to light.  Neck: Normal range of motion. Neck supple. No tracheal deviation present.  Cardiovascular: Normal rate, regular rhythm, normal heart sounds and intact distal pulses.   No murmur heard. Pulmonary/Chest: Effort normal and breath sounds normal. No respiratory distress. She has no wheezes. She has no rales. She exhibits no tenderness.  Abdominal: Soft. Normal appearance and bowel sounds are normal. There is tenderness in the right upper quadrant, epigastric area and left upper quadrant. There is positive Murphy's sign. There is no rigidity, no rebound, no guarding, no CVA tenderness and no tenderness at McBurney's point.  Musculoskeletal: Normal range of motion.  Neurological: She is alert and oriented to person, place, and time.  Skin: Skin is warm and dry.  Psychiatric: She has a normal mood and affect. Her behavior is normal. Thought content normal.  Nursing note and vitals reviewed.  ED Course  Procedures (including critical care  time) Labs Review Labs Reviewed  CBC - Abnormal; Notable for the following:    Hemoglobin 11.9 (*)    All other components within normal limits  COMPREHENSIVE METABOLIC PANEL - Abnormal; Notable for the following:    Glucose, Bld 142 (*)    All other components within normal limits  URINALYSIS, ROUTINE W REFLEX MICROSCOPIC (NOT AT [facility])  LIPASE, BLOOD   Imaging Review [place] Abdomen Limited Ruq  09/17/2015  CLINICAL DATA:  Abdominal pain EXAM: US ABDOMEN LIMITED - RIGHT UPPER QUADRANT COMPARISON:  CT scan 09/12/2015 FINDINGS: Gallbladder: No gallstones or wall thickening visualized. No sonographic Murphy sign noted by sonographer. Common bile duct: Diameter: 2.2 mm Liver: No focal lesion identified. Within normal limits in parenchymal echogenicity. IMPRESSION: Unremarkable right upper quadrant ultrasound. Electronically Signed   By: Natasha MeadLiviu  Pop  M.D.   On: 09/17/2015 11:31   I have personally reviewed and evaluated these images and lab results as part of my medical decision-making.   EKG Interpretation None      MDM  I have reviewed and evaluated the relevant laboratory values I have reviewed and evaluated the relevant imaging studies.  I have reviewed the relevant previous healthcare records. I obtained HPI from historian. Patient discussed with supervising physician  ED Course:  Assessment: Pt is a 29yF with hx DM who presents with LUQ abdominal pain x several weeks. Hx same with 3 visits to ED. Seen endocrine yesterday Given GI referral, but has not made appointment yet.. On exam, pt in NAD. Nontoxic/nonseptic appearing. VSS. Afebrile. Lungs CTA. Heart RRR. Abdomen TTP LUQ, RUQ. Previous workups unremarkable with recent CT on 09-12-15 unremarkable. Possible chole? US unremarkable with no cholecystitis. Labs unremarkable. No gap. Lipase WNL. NS bolus given with reglan and analgesia. Symptoms improved in ED. Gastroparesis? Plan is to DC home with Reglan and follow up with GI referral. At time  of discharge, Patient is in no acute distress. Vital Signs are stable. Patient is able to ambulate. Patient able to tolerate PO.    Disposition/Plan:  DC home Additional Verbal discharge instructions given and discussed with patient.  Pt Instructed to f/u with GI from referral previously given in the next week for evaluation and treatment of symptoms. Return precautions given Pt acknowledges and agrees with plan  Supervising Physician Melene Planan Floyd, DO   Final diagnoses:  RUQ pain  Gastroparesis    Audry Piliyler Jaan Fischel, PA-C 09/17/15 1154  Melene Planan Floyd, DO 09/17/15 1155

## 2015-09-17 NOTE — Discharge Instructions (Signed)
Please read and follow all provided instructions.  Your diagnoses today include:  1. Gastroparesis   2. RUQ pain    Tests performed today include:  Vital signs. See below for your results today.   Medications prescribed:   Take as prescribed   Home care instructions:  Follow any educational materials contained in this packet.  Follow-up instructions: Please follow-up with your GI referral for further evaluation of symptoms and treatment   Return instructions:   Please return to the Emergency Department if you do not get better, if you get worse, or new symptoms OR  - Fever (temperature greater than 101.69F)  - Bleeding that does not stop with holding pressure to the area    -Severe pain (please note that you may be more sore the day after your accident)  - Chest Pain  - Difficulty breathing  - Severe nausea or vomiting  - Inability to tolerate food and liquids  - Passing out  - Skin becoming red around your wounds  - Change in mental status (confusion or lethargy)  - New numbness or weakness     Please return if you have any other emergent concerns.

## 2015-09-17 NOTE — ED Notes (Signed)
Pt reports to the ED for eval of severe abd pain. She has been seen 3 times in the ED for this same pain. She was seen by her endocrinologist who states that it may be pancreatitis. Pt has a referral to GI but has not been able to get an appointment with them. Pt localizes the pain to the LUQ. Describes the pain as a sharp, stabbing pain. Pt reports some associated N/V.Denies any diarrhea, urinary symptoms, or vaginal bleeding or d/c. Has chills occasionally but denies any fevers. Pt She is a type 1 diabetic and her CBG was 140 mg/dl PTA. Pt A&Ox4, resp e/u, and skin warm and dry.

## 2015-09-19 ENCOUNTER — Other Ambulatory Visit: Payer: Self-pay | Admitting: Gastroenterology

## 2015-09-19 DIAGNOSIS — R110 Nausea: Secondary | ICD-10-CM

## 2015-09-20 ENCOUNTER — Ambulatory Visit (INDEPENDENT_AMBULATORY_CARE_PROVIDER_SITE_OTHER): Payer: 59 | Admitting: Licensed Clinical Social Worker

## 2015-09-20 DIAGNOSIS — F3341 Major depressive disorder, recurrent, in partial remission: Secondary | ICD-10-CM | POA: Diagnosis not present

## 2015-09-23 ENCOUNTER — Other Ambulatory Visit: Payer: Self-pay | Admitting: General Practice

## 2015-09-23 ENCOUNTER — Other Ambulatory Visit: Payer: Self-pay | Admitting: Family Medicine

## 2015-09-23 NOTE — Telephone Encounter (Signed)
Last OV 09/11/15 Clonazepam last filled 08/23/15 #60 with 0

## 2015-09-23 NOTE — Telephone Encounter (Signed)
Medication filled to pharmacy as requested.   

## 2015-09-24 MED ORDER — CLONAZEPAM 1 MG PO TABS: 1.0000 mg | ORAL_TABLET | Freq: Two times a day (BID) | ORAL | Status: DC | PRN

## 2015-09-25 ENCOUNTER — Other Ambulatory Visit: Payer: Self-pay | Admitting: Family Medicine

## 2015-09-25 ENCOUNTER — Ambulatory Visit (HOSPITAL_COMMUNITY)
Admission: RE | Admit: 2015-09-25 | Discharge: 2015-09-25 | Disposition: A | Discharge status: Home / Self Care | Payer: 59 | Source: Ambulatory Visit | Attending: Gastroenterology | Admitting: Gastroenterology

## 2015-09-25 DIAGNOSIS — R110 Nausea: Secondary | ICD-10-CM | POA: Insufficient documentation

## 2015-09-25 MED ORDER — TECHNETIUM TC 99M SULFUR COLLOID
2.0000 | Freq: Once | INTRAVENOUS | Status: AC | PRN
  Administered 2015-09-25: 2 via ORAL

## 2015-09-25 NOTE — Telephone Encounter (Signed)
Medication filled to pharmacy as requested.   

## 2015-09-27 ENCOUNTER — Ambulatory Visit (INDEPENDENT_AMBULATORY_CARE_PROVIDER_SITE_OTHER): Payer: 59 | Admitting: Licensed Clinical Social Worker

## 2015-09-27 DIAGNOSIS — F3342 Major depressive disorder, recurrent, in full remission: Secondary | ICD-10-CM

## 2015-10-02 ENCOUNTER — Ambulatory Visit: Payer: 59 | Admitting: Licensed Clinical Social Worker

## 2015-10-04 ENCOUNTER — Encounter: Payer: Self-pay | Admitting: Family Medicine

## 2015-10-07 MED ORDER — SUCRALFATE 1 G PO TABS: 1.0000 g | ORAL_TABLET | Freq: Three times a day (TID) | ORAL | 0 refills | Status: DC

## 2015-10-11 ENCOUNTER — Ambulatory Visit (INDEPENDENT_AMBULATORY_CARE_PROVIDER_SITE_OTHER): Payer: 59 | Admitting: Licensed Clinical Social Worker

## 2015-10-11 DIAGNOSIS — F3341 Major depressive disorder, recurrent, in partial remission: Secondary | ICD-10-CM | POA: Diagnosis not present

## 2015-10-25 ENCOUNTER — Other Ambulatory Visit: Payer: Self-pay | Admitting: Family Medicine

## 2015-10-25 NOTE — Telephone Encounter (Signed)
Last OV 09/11/15 Clonazepam last filled 09/24/15 #60 with 0

## 2015-10-25 NOTE — Telephone Encounter (Signed)
Medication filled to pharmacy as requested.   

## 2015-10-28 ENCOUNTER — Ambulatory Visit (INDEPENDENT_AMBULATORY_CARE_PROVIDER_SITE_OTHER): Payer: 59 | Admitting: Licensed Clinical Social Worker

## 2015-10-28 DIAGNOSIS — F3341 Major depressive disorder, recurrent, in partial remission: Secondary | ICD-10-CM | POA: Diagnosis not present

## 2015-11-01 ENCOUNTER — Other Ambulatory Visit: Payer: Self-pay | Admitting: Family Medicine

## 2015-11-02 ENCOUNTER — Encounter: Payer: Self-pay | Admitting: Family Medicine

## 2015-11-04 ENCOUNTER — Ambulatory Visit: Payer: 59 | Admitting: Licensed Clinical Social Worker

## 2015-11-04 ENCOUNTER — Other Ambulatory Visit: Payer: Self-pay | Admitting: Family Medicine

## 2015-11-07 ENCOUNTER — Ambulatory Visit: Payer: Self-pay | Admitting: Family Medicine

## 2015-11-10 ENCOUNTER — Other Ambulatory Visit: Payer: Self-pay | Admitting: Family Medicine

## 2015-11-13 ENCOUNTER — Encounter: Payer: Self-pay | Admitting: Family Medicine

## 2015-11-13 ENCOUNTER — Ambulatory Visit (INDEPENDENT_AMBULATORY_CARE_PROVIDER_SITE_OTHER): Payer: 59 | Admitting: Family Medicine

## 2015-11-13 VITALS — BP 121/83 | HR 76 | Temp 98.3°F | Resp 16 | Ht 66.0 in | Wt 173.4 lb

## 2015-11-13 DIAGNOSIS — Z23 Encounter for immunization: Secondary | ICD-10-CM

## 2015-11-13 DIAGNOSIS — G43909 Migraine, unspecified, not intractable, without status migrainosus: Secondary | ICD-10-CM | POA: Insufficient documentation

## 2015-11-13 DIAGNOSIS — F411 Generalized anxiety disorder: Secondary | ICD-10-CM | POA: Diagnosis not present

## 2015-11-13 MED ORDER — PROPRANOLOL HCL ER 80 MG PO CP24: 80.0000 mg | ORAL_CAPSULE | Freq: Every day | ORAL | 3 refills | Status: DC

## 2015-11-13 MED ORDER — SERTRALINE HCL 100 MG PO TABS: 150.0000 mg | ORAL_TABLET | Freq: Every day | ORAL | 3 refills | Status: DC

## 2015-11-13 MED ORDER — BUSPIRONE HCL 15 MG PO TABS: 15.0000 mg | ORAL_TABLET | Freq: Three times a day (TID) | ORAL | 3 refills | Status: DC

## 2015-11-13 NOTE — Assessment & Plan Note (Signed)
New to provider, ongoing for pt.  imitrex is not as effective as previously and she is not able to take NSAIDs.  Start Propranolol to help w/ both anxiety and HAs.  Continue Fioricet prn.  Reviewed supportive care and red flags that should prompt return.  Pt expressed understanding and is in agreement w/ plan.

## 2015-11-13 NOTE — Patient Instructions (Addendum)
Follow up in 6 weeks to recheck anxiety Increase the Zoloft to 1.5 tabs daily (150mg  daily) Increase the Buspar to 3x/day Start the Propranolol daily to improve both headaches and anxiety Continue to work on stress management Call with any questions or concerns Hang in there!!!
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
## 2015-11-13 NOTE — Assessment & Plan Note (Signed)
Deteriorated despite Zoloft, Buspar, and Clonazepam.  Still in therapy w/ Raynelle FanningJulie.  Will increase Buspar to TID and increase Zoloft to 150mg  daily as the thought of switching meds causes her more anxiety.  Pt expressed understanding and is in agreement w/ plan.

## 2015-11-13 NOTE — Progress Notes (Signed)
Pre visit review using our clinic review tool, if applicable. No additional management support is needed unless otherwise documented below in the visit note. 

## 2015-11-23 ENCOUNTER — Other Ambulatory Visit: Payer: Self-pay | Admitting: Family Medicine

## 2015-11-25 NOTE — Telephone Encounter (Signed)
Last oV 11/13/15 Clonazepam last filled 10/25/15 #60 with 0

## 2015-11-25 NOTE — Telephone Encounter (Signed)
Medication filled to pharmacy as requested.   

## 2015-12-17 ENCOUNTER — Encounter: Payer: Self-pay | Admitting: General Practice

## 2015-12-26 ENCOUNTER — Ambulatory Visit: Payer: Self-pay | Admitting: Family Medicine

## 2015-12-30 LAB — HM DIABETES EYE EXAM

## 2016-01-02 ENCOUNTER — Encounter: Payer: Self-pay | Admitting: General Practice

## 2016-01-19 ENCOUNTER — Other Ambulatory Visit: Payer: Self-pay | Admitting: Family Medicine

## 2016-02-18 ENCOUNTER — Other Ambulatory Visit: Payer: Self-pay | Admitting: Family Medicine

## 2016-02-26 ENCOUNTER — Other Ambulatory Visit: Payer: Self-pay | Admitting: Family Medicine

## 2016-03-18 ENCOUNTER — Emergency Department (HOSPITAL_COMMUNITY)
Admission: EM | Admit: 2016-03-18 | Discharge: 2016-03-18 | Disposition: A | Discharge status: Home / Self Care | Payer: No Typology Code available for payment source | Attending: Emergency Medicine | Admitting: Emergency Medicine

## 2016-03-18 ENCOUNTER — Emergency Department (HOSPITAL_COMMUNITY): Payer: No Typology Code available for payment source

## 2016-03-18 ENCOUNTER — Other Ambulatory Visit: Payer: Self-pay | Admitting: Family Medicine

## 2016-03-18 ENCOUNTER — Encounter (HOSPITAL_COMMUNITY): Payer: Self-pay | Admitting: Emergency Medicine

## 2016-03-18 DIAGNOSIS — S161XXA Strain of muscle, fascia and tendon at neck level, initial encounter: Secondary | ICD-10-CM | POA: Diagnosis not present

## 2016-03-18 DIAGNOSIS — S39012A Strain of muscle, fascia and tendon of lower back, initial encounter: Secondary | ICD-10-CM

## 2016-03-18 DIAGNOSIS — Y999 Unspecified external cause status: Secondary | ICD-10-CM | POA: Diagnosis not present

## 2016-03-18 DIAGNOSIS — Z794 Long term (current) use of insulin: Secondary | ICD-10-CM | POA: Diagnosis not present

## 2016-03-18 DIAGNOSIS — Y92410 Unspecified street and highway as the place of occurrence of the external cause: Secondary | ICD-10-CM | POA: Insufficient documentation

## 2016-03-18 DIAGNOSIS — S199XXA Unspecified injury of neck, initial encounter: Secondary | ICD-10-CM | POA: Diagnosis present

## 2016-03-18 DIAGNOSIS — S3992XA Unspecified injury of lower back, initial encounter: Secondary | ICD-10-CM | POA: Diagnosis not present

## 2016-03-18 DIAGNOSIS — E119 Type 2 diabetes mellitus without complications: Secondary | ICD-10-CM | POA: Diagnosis not present

## 2016-03-18 DIAGNOSIS — Y939 Activity, unspecified: Secondary | ICD-10-CM | POA: Diagnosis not present

## 2016-03-18 LAB — POC URINE PREG, ED: Preg Test, Ur: NEGATIVE

## 2016-03-18 MED ORDER — CYCLOBENZAPRINE HCL 10 MG PO TABS
10.0000 mg | ORAL_TABLET | Freq: Once | ORAL | Status: AC
  Administered 2016-03-18: 10 mg via ORAL
  Filled 2016-03-18: qty 1

## 2016-03-18 MED ORDER — ACETAMINOPHEN 500 MG PO TABS
1000.0000 mg | ORAL_TABLET | Freq: Once | ORAL | Status: AC
  Administered 2016-03-18: 1000 mg via ORAL
  Filled 2016-03-18: qty 2

## 2016-03-18 MED ORDER — SUMATRIPTAN SUCCINATE 100 MG PO TABS: 50.0000 mg | ORAL_TABLET | ORAL | 0 refills | Status: DC | PRN

## 2016-03-18 MED ORDER — CYCLOBENZAPRINE HCL 10 MG PO TABS: 10.0000 mg | ORAL_TABLET | Freq: Two times a day (BID) | ORAL | 0 refills | Status: DC | PRN

## 2016-03-18 NOTE — Discharge Instructions (Signed)
Your x-rays today are normal. You likely sustained a muscular strain of your neck and back. Take 1000 mg of Tylenol every 6 hours. Take flexeril twice daily or nightly for pain and muscle stiffness. Ice to her neck and back 10-20 minutes 3 times daily. After 1-2 days you may alternate with heat. Follow-up with your primary care physician in one week. Return to the emergency department for any new or worsening symptoms.
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
## 2016-03-19 ENCOUNTER — Other Ambulatory Visit: Payer: Self-pay | Admitting: Family Medicine

## 2016-03-19 NOTE — Telephone Encounter (Signed)
Last OV 11/13/15 Clonazepam last filled 11/25/15 #60 with 3

## 2016-03-19 NOTE — Telephone Encounter (Signed)
Medication filled to pharmacy as requested.   

## 2016-03-21 ENCOUNTER — Encounter (HOSPITAL_COMMUNITY): Payer: Self-pay

## 2016-03-21 ENCOUNTER — Emergency Department (HOSPITAL_COMMUNITY)
Admission: EM | Admit: 2016-03-21 | Discharge: 2016-03-21 | Disposition: A | Discharge status: Home / Self Care | Payer: No Typology Code available for payment source | Attending: Emergency Medicine | Admitting: Emergency Medicine

## 2016-03-21 DIAGNOSIS — Z794 Long term (current) use of insulin: Secondary | ICD-10-CM | POA: Insufficient documentation

## 2016-03-21 DIAGNOSIS — S161XXD Strain of muscle, fascia and tendon at neck level, subsequent encounter: Secondary | ICD-10-CM | POA: Diagnosis not present

## 2016-03-21 DIAGNOSIS — M62830 Muscle spasm of back: Secondary | ICD-10-CM | POA: Diagnosis not present

## 2016-03-21 DIAGNOSIS — Z79899 Other long term (current) drug therapy: Secondary | ICD-10-CM | POA: Insufficient documentation

## 2016-03-21 DIAGNOSIS — M542 Cervicalgia: Secondary | ICD-10-CM

## 2016-03-21 DIAGNOSIS — M545 Low back pain, unspecified: Secondary | ICD-10-CM

## 2016-03-21 DIAGNOSIS — S199XXD Unspecified injury of neck, subsequent encounter: Secondary | ICD-10-CM | POA: Diagnosis present

## 2016-03-21 DIAGNOSIS — E119 Type 2 diabetes mellitus without complications: Secondary | ICD-10-CM | POA: Insufficient documentation

## 2016-03-21 DIAGNOSIS — T148XXA Other injury of unspecified body region, initial encounter: Secondary | ICD-10-CM

## 2016-03-21 MED ORDER — METHOCARBAMOL 750 MG PO TABS: 750.0000 mg | ORAL_TABLET | Freq: Three times a day (TID) | ORAL | 0 refills | Status: DC | PRN

## 2016-03-21 MED ORDER — KETOROLAC TROMETHAMINE 30 MG/ML IJ SOLN
30.0000 mg | Freq: Once | INTRAMUSCULAR | Status: AC
  Administered 2016-03-21: 30 mg via INTRAMUSCULAR
  Filled 2016-03-21: qty 1

## 2016-03-21 MED ORDER — METHOCARBAMOL 500 MG PO TABS
500.0000 mg | ORAL_TABLET | Freq: Once | ORAL | Status: AC
  Administered 2016-03-21: 500 mg via ORAL
  Filled 2016-03-21: qty 1

## 2016-03-21 MED ORDER — HYDROCODONE-ACETAMINOPHEN 5-325 MG PO TABS
1.0000 | ORAL_TABLET | Freq: Once | ORAL | Status: AC
  Administered 2016-03-21: 1 via ORAL
  Filled 2016-03-21: qty 1

## 2016-03-21 MED ORDER — MELOXICAM 15 MG PO TABS: 15.0000 mg | ORAL_TABLET | Freq: Every day | ORAL | 0 refills | Status: DC

## 2016-03-21 MED ORDER — RANITIDINE HCL 150 MG PO TABS: 150.0000 mg | ORAL_TABLET | Freq: Two times a day (BID) | ORAL | 0 refills | Status: DC

## 2016-03-21 MED ORDER — HYDROCODONE-ACETAMINOPHEN 5-325 MG PO TABS: 1.0000 | ORAL_TABLET | Freq: Four times a day (QID) | ORAL | 0 refills | Status: DC | PRN
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
## 2016-03-21 NOTE — Discharge Instructions (Signed)
Take mobic as directed for inflammation and pain with zantac to protect your stomach while you're taking zantac; take Norco for breakthrough pain and robaxin for muscle relaxation. Do not drive or operate machinery with muscle relaxant or norco use. DO NOT COMBINE THESE WITH ALCOHOL OR WITH YOUR HOME KLONOPIN. Use heat to areas of soreness, no more than 20 minutes at a time every hour. Expect to be sore for the next few days and follow up with primary care physician for recheck of ongoing symptoms at your already scheduled appointment on Monday 03/23/16. Return to ER for emergent changing or worsening of symptoms.   More instructions on your Back Pain: Your back pain should be treated with medicines such as ibuprofen or aleve or mobic, and this back pain should get better over the next 2 weeks.  However if you develop severe or worsening pain, low back pain with fever, numbness, weakness or inability to walk or urinate, you should return to the ER immediately.  Please follow up with your doctor this week for a recheck if still having symptoms.  Avoid heavy lifting over 10 pounds over the next two weeks.  Low back pain is discomfort in the lower back that may be due to injuries to muscles and ligaments around the spine.  Occasionally, it may be caused by a a problem to a part of the spine called a disc.  The pain may last several days or a week;  However, most patients get completely well in 4 weeks.  Self - care:  The application of heat can help soothe the pain.  Maintaining your daily activities, including walking, is encourged, as it will help you get better faster than just staying in bed. Perform gentle stretching as discussed. Drink plenty of fluids.  Medications are also useful to help with pain control.  A commonly prescribed medication includes norco.  Do not drive or operate heavy machinery while taking this medication.  Non steroidal anti inflammatory medications including Ibuprofen and naproxen;   These medications help both pain and swelling and are very useful in treating back pain.  They should be taken with food, as they can cause stomach upset, and more seriously, stomach bleeding.    Muscle relaxants:  These medications can help with muscle tightness that is a cause of lower back pain.  Most of these medications can cause drowsiness, and it is not safe to drive or use dangerous machinery while taking them.  SEEK IMMEDIATE MEDICAL ATTENTION IF: New numbness, tingling, weakness, or problem with the use of your arms or legs.  Severe back pain not relieved with medications.  Difficulty with or loss of control of your bowel or bladder control.  Increasing pain in any areas of the body (such as chest or abdominal pain).  Shortness of breath, dizziness or fainting.  Nausea (feeling sick to your stomach), vomiting, fever, or sweats.  You will need to follow up with  Your primary healthcare provider in 1-2 weeks for reassessment.
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
## 2016-03-23 ENCOUNTER — Ambulatory Visit (INDEPENDENT_AMBULATORY_CARE_PROVIDER_SITE_OTHER): Payer: Self-pay | Admitting: Family Medicine

## 2016-03-23 ENCOUNTER — Encounter: Payer: Self-pay | Admitting: Family Medicine

## 2016-03-23 VITALS — BP 121/81 | HR 78 | Temp 97.9°F | Resp 16 | Ht 66.0 in | Wt 181.0 lb

## 2016-03-23 DIAGNOSIS — M542 Cervicalgia: Secondary | ICD-10-CM

## 2016-03-23 MED ORDER — KETOROLAC TROMETHAMINE 60 MG/2ML IM SOLN
60.0000 mg | Freq: Once | INTRAMUSCULAR | Status: AC
  Administered 2016-03-23: 60 mg via INTRAMUSCULAR
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
## 2016-03-23 NOTE — Patient Instructions (Signed)
Follow up as needed We'll call you with your [facility] appt HOLD your Meloxicam/Mobic today and tomorrow b/c of the Toradol injection Continue the Robaxin, Vicodin (as needed) heating pad.  You can add tylenol as needed Resume the Mobic on Wednesday- take w/ food Call with any questions or concerns Hang in there!!!

## 2016-03-23 NOTE — Progress Notes (Signed)
Pre visit review using our clinic review tool, if applicable. No additional management support is needed unless otherwise documented below in the visit note. 

## 2016-03-24 ENCOUNTER — Encounter: Payer: Self-pay | Admitting: Family Medicine

## 2016-03-26 MED ORDER — HYDROCODONE-ACETAMINOPHEN 5-325 MG PO TABS: 1.0000 | ORAL_TABLET | Freq: Four times a day (QID) | ORAL | 0 refills | Status: DC | PRN

## 2016-03-26 NOTE — Telephone Encounter (Signed)
Indication for chronic opioid: not chronic- acute Medication and dose: Hydrocodone 5/325mg  q6 prn # pills per month: 21 Last UDS date:   Pain contract signed (Y/N): Y Date narcotic database last reviewed (include red flags): 03/26/16- no red flags

## 2016-04-03 ENCOUNTER — Other Ambulatory Visit: Payer: Self-pay | Admitting: Family Medicine

## 2016-04-09 ENCOUNTER — Encounter: Payer: Self-pay | Admitting: Family Medicine

## 2016-04-09 NOTE — Telephone Encounter (Signed)
Last OV 03/23/16 Hydrocodone last filled 03/26/16 #21 with 0

## 2016-04-10 ENCOUNTER — Encounter: Payer: Self-pay | Admitting: General Practice

## 2016-04-10 MED ORDER — HYDROCODONE-ACETAMINOPHEN 5-325 MG PO TABS: 1.0000 | ORAL_TABLET | Freq: Four times a day (QID) | ORAL | 0 refills | Status: DC | PRN

## 2016-04-10 NOTE — Telephone Encounter (Signed)
Ok for #21 Pt has controlled substance agreement Database checked 04/10/16- no red flags

## 2016-04-19 ENCOUNTER — Other Ambulatory Visit: Payer: Self-pay | Admitting: Family Medicine

## 2016-04-23 ENCOUNTER — Other Ambulatory Visit: Payer: Self-pay | Admitting: Family Medicine

## 2016-04-23 ENCOUNTER — Emergency Department (HOSPITAL_COMMUNITY)
Admission: EM | Admit: 2016-04-23 | Discharge: 2016-04-23 | Disposition: A | Discharge status: Home / Self Care | Payer: 59 | Attending: Emergency Medicine | Admitting: Emergency Medicine

## 2016-04-23 ENCOUNTER — Encounter (HOSPITAL_COMMUNITY): Payer: Self-pay | Admitting: Emergency Medicine

## 2016-04-23 DIAGNOSIS — R739 Hyperglycemia, unspecified: Secondary | ICD-10-CM

## 2016-04-23 DIAGNOSIS — Z79899 Other long term (current) drug therapy: Secondary | ICD-10-CM | POA: Diagnosis not present

## 2016-04-23 DIAGNOSIS — E1065 Type 1 diabetes mellitus with hyperglycemia: Secondary | ICD-10-CM | POA: Insufficient documentation

## 2016-04-23 LAB — CBG MONITORING, ED
GLUCOSE-CAPILLARY: 192 mg/dL — AB (ref 65–99)
GLUCOSE-CAPILLARY: 232 mg/dL — AB (ref 65–99)
Glucose-Capillary: 175 mg/dL — ABNORMAL HIGH (ref 65–99)

## 2016-04-23 LAB — CBC
HEMATOCRIT: 36.7 % (ref 36.0–46.0)
HEMOGLOBIN: 12.7 g/dL (ref 12.0–15.0)
MCH: 30.2 pg (ref 26.0–34.0)
MCHC: 34.6 g/dL (ref 30.0–36.0)
MCV: 87.4 fL (ref 78.0–100.0)
Platelets: 179 10*3/uL (ref 150–400)
RBC: 4.2 MIL/uL (ref 3.87–5.11)
RDW: 13.2 % (ref 11.5–15.5)
WBC: 5.9 10*3/uL (ref 4.0–10.5)

## 2016-04-23 LAB — I-STAT VENOUS BLOOD GAS, ED
ACID-BASE EXCESS: 2 mmol/L (ref 0.0–2.0)
Bicarbonate: 25 mmol/L (ref 20.0–28.0)
O2 SAT: 71 %
PO2 VEN: 34 mmHg (ref 32.0–45.0)
TCO2: 26 mmol/L (ref 0–100)
pCO2, Ven: 32.7 mmHg — ABNORMAL LOW (ref 44.0–60.0)
pH, Ven: 7.491 — ABNORMAL HIGH (ref 7.250–7.430)

## 2016-04-23 LAB — BASIC METABOLIC PANEL
ANION GAP: 13 (ref 5–15)
BUN: 15 mg/dL (ref 6–20)
CHLORIDE: 99 mmol/L — AB (ref 101–111)
CO2: 25 mmol/L (ref 22–32)
Calcium: 9.6 mg/dL (ref 8.9–10.3)
Creatinine, Ser: 0.86 mg/dL (ref 0.44–1.00)
GFR calc Af Amer: >60 mL/min (ref >60)
Glucose, Bld: 181 mg/dL — ABNORMAL HIGH (ref 65–99)
POTASSIUM: 3.8 mmol/L (ref 3.5–5.1)
SODIUM: 137 mmol/L (ref 135–145)

## 2016-04-23 LAB — URINALYSIS, ROUTINE W REFLEX MICROSCOPIC
BACTERIA UA: NONE SEEN
Bilirubin Urine: NEGATIVE
Glucose, UA: 150 mg/dL — AB
Ketones, ur: NEGATIVE mg/dL
NITRITE: NEGATIVE
PROTEIN: NEGATIVE mg/dL
SPECIFIC GRAVITY, URINE: 1.017 (ref 1.005–1.030)
pH: 6 (ref 5.0–8.0)

## 2016-04-23 LAB — POC URINE PREG, ED: Preg Test, Ur: NEGATIVE

## 2016-04-23 MED ORDER — SODIUM CHLORIDE 0.9 % IV BOLUS (SEPSIS)
1000.0000 mL | Freq: Once | INTRAVENOUS | Status: AC
  Administered 2016-04-23: 1000 mL via INTRAVENOUS

## 2016-04-23 MED ORDER — ONDANSETRON HCL 4 MG/2ML IJ SOLN
4.0000 mg | Freq: Once | INTRAMUSCULAR | Status: AC
  Administered 2016-04-23: 4 mg via INTRAVENOUS
  Filled 2016-04-23: qty 2

## 2016-04-23 NOTE — ED Notes (Signed)
Corena PilgrimKaitlynne, RN notified of abnormal lab test result

## 2016-04-23 NOTE — Telephone Encounter (Signed)
Medication filled to pharmacy as requested.   

## 2016-04-23 NOTE — ED Triage Notes (Signed)
Pt here for hyperglycemia; pt sts her insulin pump malfunctioned; pt has had 16U humalog today

## 2016-04-23 NOTE — ED Provider Notes (Signed)
[facility]PT Provider Note   CSN: [id number] Arrival date & time: 04/23/16  1357     History   Chief Complaint Chief Complaint  Patient presents with  . Hyperglycemia    HPI Michelle Riley is a 31 y.o. female.  HPI   Patient is a 31 year old female with history of type 1 diabetes who presents the ED with complaint of hyperglycemia. Patient reports over the past 4-5 days her sugars have been running high in the range of 4 to 500s. Patient reports wearing an insulin pump which administers Humalog. She notes she called her pump nurse today when she was notified that her insulin pump appears to be malfunctioning. Patient reports removing her pump and administering 16 units of Humalog earlier this afternoon. She notes over the past few days she has had associated weakness, abdominal pain, nausea, urinary frequency. Denies fever, cough, shortness of breath, chest pain, vomiting, diarrhea, dysuria. Denies taking any other medications for her symptoms.  Past Medical History:  Diagnosis Date  . Anxiety   . Chronic benign neutropenia (HCC)   . Diabetes mellitus without complication (HCC)   . Severe major depression [facility](HCC)     Patient Active Problem List   Diagnosis Date Noted  . Migraine 11/13/2015  . Abdominal pain, left upper quadrant 09/11/2015  . Nausea with vomiting 09/11/2015  . Major depressive disorder, recurrent, severe without psychotic features (HCC)   . GAD (generalized anxiety disorder) 10/11/2014  . Panic attacks 10/11/2014  . MDD (major depressive disorder), recurrent episode, severe (HCC) 10/10/2014  . Diabetic ketoacidosis without coma associated with type 1 diabetes mellitus (HCC)   . SOB (shortness of breath)   . DKA (diabetic ketoacidoses) (HCC) 07/27/2014    Past Surgical History:  Procedure Laterality Date  . KNEE SURGERY    . TONSILLECTOMY    . TONSILLECTOMY AND ADENOIDECTOMY    . WISDOM TOOTH EXTRACTION      OB History    No data  available       Home Medications    Prior to Admission medications   Medication Sig Start Date End Date Taking? Authorizing Provider  acetaminophen (TYLENOL) 500 MG tablet Take 500 mg by mouth every 6 (six) hours as needed for moderate pain.    Yes Historical Provider, MD  busPIRone (BUSPAR) 15 MG tablet Take 1 tablet (15 mg total) by mouth 3 (three) times daily. 11/13/15  Yes Sheliah HatchKatherine E Tabori, MD  clonazePAM (KLONOPIN) 1 MG tablet TAKE 1 TABLET BY MOUTH TWICE DAILY AS NEEDED FOR ANXIETY 04/23/16  Yes Sheliah HatchKatherine E Tabori, MD  GLUCAGEN HYPOKIT 1 MG SOLR injection Inject 1 application into the skin once. FOR HYPOGLYCEMIA 09/06/14  Yes Historical Provider, MD  HUMALOG 100 UNIT/ML injection Inject into the skin continuous. Insulin pump 03/03/16  Yes Historical Provider, MD  HYDROcodone-acetaminophen (NORCO) 5-325 MG tablet Take 1 tablet by mouth every 6 (six) hours as needed for severe pain. 04/10/16  Yes Sheliah HatchKatherine E Tabori, MD  omeprazole (PRILOSEC) 40 MG capsule TK 1 C PO QD 10/29/15  Yes Historical Provider, MD  promethazine (PHENERGAN) 12.5 MG tablet Take 2 tablets (25 mg total) by mouth every 8 (eight) hours as needed for nausea or vomiting. 09/17/15  Yes Audry Piliyler Mohr, PA-C  propranolol ER (INDERAL LA) 80 MG 24 hr capsule Take 1 capsule (80 mg total) by mouth daily. 11/13/15  Yes Sheliah HatchKatherine E Tabori, MD  sertraline (ZOLOFT) 100 MG tablet TAKE 1 AND 1/2 TABLETS(150 MG) BY MOUTH DAILY 04/20/16  Yes Sheliah Hatch, MD  SUMAtriptan (IMITREX) 100 MG tablet TAKE 1/2 TABLET(50 MG) BY MOUTH EVERY 2 HOURS AS NEEDED FOR MIGRAINE. MAY REPEAT IN 2 HOURS IF HEADACHE PERSISTS OR RECURS 04/03/16  Yes Sheliah Hatch, MD    Family History Family History  Problem Relation Age of Onset  . Diabetes Mellitus II Maternal Grandmother   . Diabetes Mellitus II Paternal Grandfather   . Leukemia Paternal Grandfather   . Hypertension Mother     Social History Social History  Substance Use Topics  . Smoking status:  Never Smoker  . Smokeless tobacco: Never Used  . Alcohol use No     Allergies   Other; Paxil [paroxetine]; and Prednisone   Review of Systems Review of Systems  Gastrointestinal: Positive for abdominal pain and nausea.  Genitourinary: Positive for frequency.  Neurological: Positive for weakness.  All other systems reviewed and are negative.    Physical Exam Updated Vital Signs BP 95/62 (BP Location: Right Arm)   Pulse 71   Temp 98.1 F (36.7 C) (Oral)   Resp 16   SpO2 100%   Physical Exam  Constitutional: She is oriented to person, place, and time. She appears well-developed and well-nourished. No distress.  HENT:  Head: Normocephalic and atraumatic.  Mouth/Throat: Uvula is midline, oropharynx is clear and moist and mucous membranes are normal. No oropharyngeal exudate, posterior oropharyngeal edema, posterior oropharyngeal erythema or tonsillar abscesses. No tonsillar exudate.  Eyes: Conjunctivae and EOM are normal. Right eye exhibits no discharge. Left eye exhibits no discharge. No scleral icterus.  Neck: Normal range of motion. Neck supple.  Cardiovascular: Normal rate, regular rhythm, normal heart sounds and intact distal pulses.   HR 88  Pulmonary/Chest: Effort normal and breath sounds normal. No respiratory distress. She has no wheezes. She has no rales. She exhibits no tenderness.  Abdominal: Soft. Bowel sounds are normal. She exhibits no distension and no mass. There is tenderness. There is no rebound and no guarding. No hernia.  Mild diffuse abdominal tenderness, slightly worse over epigastric region.  Musculoskeletal: She exhibits no edema.  Neurological: She is alert and oriented to person, place, and time.  Skin: Skin is warm and dry. She is not diaphoretic.  Nursing note and vitals reviewed.    ED Treatments / Results  Labs (all labs ordered are listed, but only abnormal results are displayed) Labs Reviewed  BASIC METABOLIC PANEL - Abnormal; Notable for  the following:       Result Value   Chloride 99 (*)    Glucose, Bld 181 (*)    All other components within normal limits  URINALYSIS, ROUTINE W REFLEX MICROSCOPIC - Abnormal; Notable for the following:    Glucose, UA 150 (*)    Hgb urine dipstick LARGE (*)    Leukocytes, UA TRACE (*)    Squamous Epithelial / LPF 0-5 (*)    All other components within normal limits  CBG MONITORING, ED - Abnormal; Notable for the following:    Glucose-Capillary 192 (*)    All other components within normal limits  I-STAT VENOUS BLOOD GAS, ED - Abnormal; Notable for the following:    pH, Ven 7.491 (*)    pCO2, Ven 32.7 (*)    All other components within normal limits  CBG MONITORING, ED - Abnormal; Notable for the following:    Glucose-Capillary 232 (*)    All other components within normal limits  CBG MONITORING, ED - Abnormal; Notable for the following:    Glucose-Capillary  175 (*)    All other components within normal limits  CBC  BLOOD GAS, VENOUS  POC URINE PREG, ED    EKG  EKG Interpretation None       Radiology No results found.  Procedures Procedures (including critical care time)  Medications Ordered in ED Medications  sodium chloride 0.9 % bolus 1,000 mL (0 mLs Intravenous Stopped 04/23/16 1735)  ondansetron (ZOFRAN) injection 4 mg (4 mg Intravenous Given 04/23/16 1633)     Initial Impression / Assessment and Plan / ED Course  I have reviewed the triage vital signs and the nursing notes.  Pertinent labs & imaging results that were available during my care of the patient were reviewed by me and considered in my medical decision making (see chart for details).     Patient is a 31 year old female with history of type 1 diabetes who presents the ED with complaint of hyperglycemia. Patient reports over the past 4-5 days she has had elevated glucose levels ranging from 400-500. She also reports having associated symptoms of shortness of breath, abdominal pain, nausea which made  her concerned that she may be in DKA. She notes she contacted her insulin pump nurse today who advised her that her pump appeared to be malfunctioning resulting her removing the pump earlier today. Patient reports administering 16 units of Humalog prior to arrival. VSS. Exam revealed mild diffuse tenderness, no peritoneal signs. Remaining exam unremarkable. Patient given IV fluids. CBG 175. Labs showed glucose 181, no anion gap. UA without ketones of signs of infection. Repeat CBG s/p IVF 175. Patient symptoms are likely due to hyperglycemia. No evidence of DKA on workup today in the ED. On reevaluation patient is resting comfortably in bed and reports improvement of symptoms after IV fluids. Pt able to tolerate PO. Discussed results and plan for discharge with patient. Plan to have patient continue administering her Humalog using the sliding scale at home. Advised patient to call her insulin pump nurse/endocrinologist today for follow-up this week for further management of her diabetes/insulin pump. Discussed return precautions with patient.  Final Clinical Impressions(s) / ED Diagnoses   Final diagnoses:  Hyperglycemia    New Prescriptions Discharge Medication List as of 04/23/2016  5:58 PM       Barrett Henle, PA-C 04/23/16 1841    Maia Plan, MD 04/24/16 [phone]

## 2016-04-23 NOTE — Discharge Instructions (Signed)
I recommend continuing to use your Humalog at home on a sliding scale until you're able to follow-up with your pump nurse or endocrinologist for further management of your diabetes. Please return to the Emergency Department if symptoms worsen or new onset of fever, chest pain, difficulty breathing, abdominal pain, vomiting, pain when urinating, altered mental status, confusion.

## 2016-04-23 NOTE — Telephone Encounter (Signed)
Last OV 03/23/16 Clonazepam last filled 03/19/16 #60 with 0

## 2016-04-23 NOTE — ED Notes (Signed)
Pt stable, ambulatory, states understanding of discharge instructions 

## 2016-05-02 ENCOUNTER — Other Ambulatory Visit: Payer: Self-pay | Admitting: Family Medicine

## 2016-05-04 ENCOUNTER — Other Ambulatory Visit: Payer: Self-pay | Admitting: Family Medicine

## 2016-05-22 ENCOUNTER — Other Ambulatory Visit: Payer: Self-pay | Admitting: Family Medicine

## 2016-06-16 ENCOUNTER — Other Ambulatory Visit: Payer: Self-pay | Admitting: Family Medicine

## 2016-07-02 ENCOUNTER — Emergency Department (HOSPITAL_COMMUNITY)
Admission: EM | Admit: 2016-07-02 | Discharge: 2016-07-03 | Disposition: A | Discharge status: Home / Self Care | Payer: 59 | Attending: Emergency Medicine | Admitting: Emergency Medicine

## 2016-07-02 ENCOUNTER — Encounter (HOSPITAL_COMMUNITY): Payer: Self-pay | Admitting: Emergency Medicine

## 2016-07-02 ENCOUNTER — Emergency Department (HOSPITAL_COMMUNITY): Payer: 59

## 2016-07-02 DIAGNOSIS — Z79899 Other long term (current) drug therapy: Secondary | ICD-10-CM | POA: Insufficient documentation

## 2016-07-02 DIAGNOSIS — Y92410 Unspecified street and highway as the place of occurrence of the external cause: Secondary | ICD-10-CM | POA: Insufficient documentation

## 2016-07-02 DIAGNOSIS — Z794 Long term (current) use of insulin: Secondary | ICD-10-CM | POA: Insufficient documentation

## 2016-07-02 DIAGNOSIS — E119 Type 2 diabetes mellitus without complications: Secondary | ICD-10-CM | POA: Diagnosis not present

## 2016-07-02 DIAGNOSIS — Y939 Activity, unspecified: Secondary | ICD-10-CM | POA: Insufficient documentation

## 2016-07-02 DIAGNOSIS — S39012A Strain of muscle, fascia and tendon of lower back, initial encounter: Secondary | ICD-10-CM | POA: Diagnosis not present

## 2016-07-02 DIAGNOSIS — Y999 Unspecified external cause status: Secondary | ICD-10-CM | POA: Insufficient documentation

## 2016-07-02 DIAGNOSIS — S161XXA Strain of muscle, fascia and tendon at neck level, initial encounter: Secondary | ICD-10-CM | POA: Diagnosis not present

## 2016-07-02 DIAGNOSIS — S199XXA Unspecified injury of neck, initial encounter: Secondary | ICD-10-CM | POA: Diagnosis present

## 2016-07-02 LAB — I-STAT BETA HCG BLOOD, ED (MC, WL, AP ONLY)

## 2016-07-02 MED ORDER — CYCLOBENZAPRINE HCL 10 MG PO TABS
5.0000 mg | ORAL_TABLET | Freq: Once | ORAL | Status: AC
  Administered 2016-07-02: 5 mg via ORAL
  Filled 2016-07-02: qty 1

## 2016-07-02 MED ORDER — KETOROLAC TROMETHAMINE 30 MG/ML IJ SOLN: 30.0000 mg | Freq: Once | INTRAMUSCULAR | Status: DC

## 2016-07-02 MED ORDER — KETOROLAC TROMETHAMINE 60 MG/2ML IM SOLN
30.0000 mg | Freq: Once | INTRAMUSCULAR | Status: AC
  Administered 2016-07-02: 30 mg via INTRAMUSCULAR
  Filled 2016-07-02: qty 2
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
## 2016-07-02 NOTE — Discharge Instructions (Signed)
Please read instructions below. Talk with your PCP about any new medications. Schedule an appointment with Dr. Noel Gerold for follow up on Monday. You can take advil in addition to percocet for added pain relief as needed, be sure to stagger your doses. You can take the muscle relaxer at night for muscle spasm. Apply ice to your neck and back for 20 minutes at a time. You can also apply heat. Return to ER if new numbness or tingling in your arms or legs, inability to urinate, inability to hold your bowels, or weakness in your extremities.

## 2016-07-03 DIAGNOSIS — S161XXA Strain of muscle, fascia and tendon at neck level, initial encounter: Secondary | ICD-10-CM | POA: Diagnosis not present

## 2016-07-03 MED ORDER — OXYCODONE-ACETAMINOPHEN 5-325 MG PO TABS: 2.0000 | ORAL_TABLET | Freq: Four times a day (QID) | ORAL | 0 refills | Status: DC | PRN

## 2016-07-03 MED ORDER — TIZANIDINE HCL 4 MG PO CAPS: 4.0000 mg | ORAL_CAPSULE | Freq: Every evening | ORAL | 0 refills | Status: DC | PRN

## 2016-07-03 MED ORDER — CLONAZEPAM 0.5 MG PO TABS
1.0000 mg | ORAL_TABLET | Freq: Once | ORAL | Status: AC
  Administered 2016-07-03: 1 mg via ORAL
  Filled 2016-07-03: qty 2

## 2016-07-04 ENCOUNTER — Other Ambulatory Visit: Payer: Self-pay | Admitting: Family Medicine

## 2016-07-15 ENCOUNTER — Other Ambulatory Visit: Payer: Self-pay | Admitting: Orthopaedic Surgery

## 2016-07-15 DIAGNOSIS — S39012A Strain of muscle, fascia and tendon of lower back, initial encounter: Secondary | ICD-10-CM

## 2016-07-16 ENCOUNTER — Ambulatory Visit
Admission: RE | Admit: 2016-07-16 | Discharge: 2016-07-16 | Disposition: A | Discharge status: Home / Self Care | Payer: No Typology Code available for payment source | Source: Ambulatory Visit | Attending: Orthopaedic Surgery | Admitting: Orthopaedic Surgery

## 2016-07-16 DIAGNOSIS — S39012A Strain of muscle, fascia and tendon of lower back, initial encounter: Secondary | ICD-10-CM

## 2016-08-01 IMAGING — NM NM GASTRIC EMPTYING
3 series · 3 of 3 positions shown · non-contrast
Comparison: None.

CLINICAL DATA: Nausea and vomiting with abdominal pain for 2 weeks

EXAM:
NUCLEAR MEDICINE GASTRIC EMPTYING SCAN
TECHNIQUE: After oral ingestion of radiolabeled meal, sequential abdominal
images were obtained for 120 minutes. Residual percentage of
activity remaining within the stomach was calculated at 60 and 120
minutes.
RADIOPHARMACEUTICALS:  2.0 mCi Jc-SSm MDP labeled sulfur colloid in
egg orally

[Series 1: 0 min · 4.14mm/px · 1 of 1 slices shown]
[im 1/1]
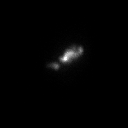

[Series 2: 1 hr · 4.14mm/px · 1 of 1 slices shown]
[im 1/1]
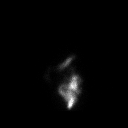

[Series 3: 2 hr · 4.14mm/px · 1 of 1 slices shown]
[im 1/1]
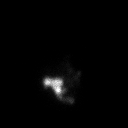

[3 of 3 positions shown; findings below may reference images not displayed]

FINDINGS: Expected location of the stomach in the left upper quadrant.
Ingested meal empties the stomach gradually over the course of the
study with 24% retention at 60 min and 8% retention at 120 min
(normal retention less than 30% at 120 min).
IMPRESSION: Normal gastric emptying study.

## 2016-10-08 LAB — MICROALBUMIN, URINE: MICROALB UR: 1.3

## 2016-10-08 LAB — HM DIABETES FOOT EXAM

## 2016-10-13 LAB — HEMOGLOBIN A1C: Hemoglobin A1C: 9

## 2016-11-02 ENCOUNTER — Other Ambulatory Visit: Payer: Self-pay | Admitting: Family Medicine

## 2016-11-05 ENCOUNTER — Encounter: Payer: Self-pay | Admitting: *Deleted

## 2016-11-13 ENCOUNTER — Ambulatory Visit (INDEPENDENT_AMBULATORY_CARE_PROVIDER_SITE_OTHER): Payer: BLUE CROSS/BLUE SHIELD | Admitting: Family Medicine

## 2016-11-13 ENCOUNTER — Encounter: Payer: Self-pay | Admitting: Family Medicine

## 2016-11-13 VITALS — BP 123/78 | HR 81 | Resp 16 | Ht 66.0 in | Wt 184.2 lb

## 2016-11-13 DIAGNOSIS — Z23 Encounter for immunization: Secondary | ICD-10-CM | POA: Diagnosis not present

## 2016-11-13 DIAGNOSIS — F332 Major depressive disorder, recurrent severe without psychotic features: Secondary | ICD-10-CM

## 2016-11-13 MED ORDER — BUSPIRONE HCL 10 MG PO TABS: 10.0000 mg | ORAL_TABLET | Freq: Three times a day (TID) | ORAL | 3 refills | Status: DC

## 2016-11-13 MED ORDER — SERTRALINE HCL 100 MG PO TABS: 100.0000 mg | ORAL_TABLET | Freq: Every day | ORAL | 3 refills | Status: DC

## 2016-11-13 MED ORDER — CLONAZEPAM 1 MG PO TABS: 1.0000 mg | ORAL_TABLET | Freq: Two times a day (BID) | ORAL | 0 refills | Status: DC | PRN
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
## 2016-11-13 NOTE — Patient Instructions (Signed)
Follow up in 3-4 weeks to recheck anxiety/depression Increase the Sertraline to 100mg  daily after 1 week Increase the Buspar to 3x/day Use the Clonazepam only as needed for panicked moments/high anxiety Call with any questions or concerns Hang in there!!!

## 2016-11-13 NOTE — Progress Notes (Signed)
Pre visit review using our clinic review tool, if applicable. No additional management support is needed unless otherwise documented below in the visit note. 

## 2016-11-13 NOTE — Assessment & Plan Note (Signed)
Deteriorated.  Pt is again having depression and anxiety- triggered by her work stress/environment.  She is having PTSD features.  Titrate Zoloft and Buspar.  Refill on Clonopin provided.  Encouraged her to continue following w/ her counselor.  Will follow.

## 2016-11-17 ENCOUNTER — Encounter: Payer: Self-pay | Admitting: General Practice

## 2016-11-24 ENCOUNTER — Ambulatory Visit (INDEPENDENT_AMBULATORY_CARE_PROVIDER_SITE_OTHER): Payer: BLUE CROSS/BLUE SHIELD | Admitting: Licensed Clinical Social Worker

## 2016-11-24 DIAGNOSIS — F4310 Post-traumatic stress disorder, unspecified: Secondary | ICD-10-CM | POA: Diagnosis not present

## 2016-12-11 ENCOUNTER — Ambulatory Visit: Payer: Self-pay | Admitting: Family Medicine

## 2016-12-17 ENCOUNTER — Ambulatory Visit (INDEPENDENT_AMBULATORY_CARE_PROVIDER_SITE_OTHER): Payer: BLUE CROSS/BLUE SHIELD | Admitting: Licensed Clinical Social Worker

## 2016-12-17 DIAGNOSIS — F4310 Post-traumatic stress disorder, unspecified: Secondary | ICD-10-CM

## 2016-12-23 ENCOUNTER — Emergency Department (HOSPITAL_BASED_OUTPATIENT_CLINIC_OR_DEPARTMENT_OTHER)
Admission: EM | Admit: 2016-12-23 | Discharge: 2016-12-23 | Disposition: A | Discharge status: Home / Self Care | Payer: BLUE CROSS/BLUE SHIELD | Attending: Physician Assistant | Admitting: Physician Assistant

## 2016-12-23 ENCOUNTER — Emergency Department (HOSPITAL_BASED_OUTPATIENT_CLINIC_OR_DEPARTMENT_OTHER): Payer: BLUE CROSS/BLUE SHIELD

## 2016-12-23 ENCOUNTER — Encounter (HOSPITAL_BASED_OUTPATIENT_CLINIC_OR_DEPARTMENT_OTHER): Payer: Self-pay | Admitting: Emergency Medicine

## 2016-12-23 ENCOUNTER — Encounter: Payer: Self-pay | Admitting: Family Medicine

## 2016-12-23 ENCOUNTER — Ambulatory Visit: Payer: Self-pay | Admitting: Physician Assistant

## 2016-12-23 DIAGNOSIS — Z794 Long term (current) use of insulin: Secondary | ICD-10-CM | POA: Insufficient documentation

## 2016-12-23 DIAGNOSIS — E109 Type 1 diabetes mellitus without complications: Secondary | ICD-10-CM | POA: Insufficient documentation

## 2016-12-23 DIAGNOSIS — R05 Cough: Secondary | ICD-10-CM | POA: Insufficient documentation

## 2016-12-23 DIAGNOSIS — R509 Fever, unspecified: Secondary | ICD-10-CM | POA: Diagnosis not present

## 2016-12-23 DIAGNOSIS — Z79899 Other long term (current) drug therapy: Secondary | ICD-10-CM | POA: Diagnosis not present

## 2016-12-23 DIAGNOSIS — R059 Cough, unspecified: Secondary | ICD-10-CM

## 2016-12-23 LAB — CBC WITH DIFFERENTIAL/PLATELET
Basophils Absolute: 0 10*3/uL (ref 0.0–0.1)
Basophils Relative: 1 %
EOS ABS: 0.1 10*3/uL (ref 0.0–0.7)
EOS PCT: 1 %
HCT: 37.7 % (ref 36.0–46.0)
Hemoglobin: 12.7 g/dL (ref 12.0–15.0)
LYMPHS ABS: 2.1 10*3/uL (ref 0.7–4.0)
Lymphocytes Relative: 30 %
MCH: 29.6 pg (ref 26.0–34.0)
MCHC: 33.7 g/dL (ref 30.0–36.0)
MCV: 87.9 fL (ref 78.0–100.0)
MONO ABS: 0.4 10*3/uL (ref 0.1–1.0)
MONOS PCT: 6 %
Neutro Abs: 4.5 10*3/uL (ref 1.7–7.7)
Neutrophils Relative %: 62 %
PLATELETS: 181 10*3/uL (ref 150–400)
RBC: 4.29 MIL/uL (ref 3.87–5.11)
RDW: 13.9 % (ref 11.5–15.5)
WBC: 7.2 10*3/uL (ref 4.0–10.5)

## 2016-12-23 LAB — COMPREHENSIVE METABOLIC PANEL
ALT: 18 U/L (ref 14–54)
AST: 22 U/L (ref 15–41)
Albumin: 3.8 g/dL (ref 3.5–5.0)
Alkaline Phosphatase: 49 U/L (ref 38–126)
Anion gap: 10 (ref 5–15)
BUN: 16 mg/dL (ref 6–20)
CHLORIDE: 101 mmol/L (ref 101–111)
CO2: 23 mmol/L (ref 22–32)
CREATININE: 0.79 mg/dL (ref 0.44–1.00)
Calcium: 8.7 mg/dL — ABNORMAL LOW (ref 8.9–10.3)
GFR calc Af Amer: >60 mL/min (ref >60)
Glucose, Bld: 300 mg/dL — ABNORMAL HIGH (ref 65–99)
Potassium: 4 mmol/L (ref 3.5–5.1)
Sodium: 134 mmol/L — ABNORMAL LOW (ref 135–145)
Total Bilirubin: 0.4 mg/dL (ref 0.3–1.2)
Total Protein: 7 g/dL (ref 6.5–8.1)

## 2016-12-23 MED ORDER — SODIUM CHLORIDE 0.9 % IV BOLUS (SEPSIS)
1000.0000 mL | Freq: Once | INTRAVENOUS | Status: AC
  Administered 2016-12-23: 1000 mL via INTRAVENOUS

## 2016-12-23 MED ORDER — BENZONATATE 100 MG PO CAPS: 100.0000 mg | ORAL_CAPSULE | Freq: Three times a day (TID) | ORAL | 0 refills | Status: DC | PRN

## 2016-12-23 MED ORDER — KETOROLAC TROMETHAMINE 30 MG/ML IJ SOLN
30.0000 mg | Freq: Once | INTRAMUSCULAR | Status: AC
  Administered 2016-12-23: 30 mg via INTRAVENOUS
  Filled 2016-12-23: qty 1

## 2016-12-23 MED ORDER — GUAIFENESIN-CODEINE 100-10 MG/5ML PO SOLN: 5.0000 mL | Freq: Four times a day (QID) | ORAL | 0 refills | Status: DC | PRN

## 2016-12-23 NOTE — ED Triage Notes (Signed)
Diagnosed at [facility] Monday with Influenza A&B.  Cough started later on Monday and is getting worse.  Chest pain with cough.  Dizziness. Taking Tamiflu.  Took Tylenol 2 hrs PTA.

## 2016-12-23 NOTE — ED Notes (Signed)
Small secretions when she cough per pt.

## 2016-12-23 NOTE — Discharge Instructions (Signed)
Return to PCP if not improving.

## 2016-12-23 NOTE — ED Provider Notes (Signed)
[facility]   CSN: [id number] Arrival date & time: 12/23/16  1719     History   Chief Complaint Chief Complaint  Patient presents with  . Cough    HPI Michelle Riley is a 31 y.o. female.  HPI   She is 31 year old female, type I diabetic. She is presenting with cough. Patient was diagnosed with the flu, 3 days ago. Patient reports she is positive post flu a flu B. She reports that this she was diagnosed at an urgent care. She had had one week preceding symptoms. She reports that her cough however has increased in the last 3 days despite the use of Tamiflu. She reports she called her primary care physician and they told her to come here told her they could not see her but they told her to come here to get evaluated. She had no vomiting, no worsening fevers, does complain of myalgias and flulike symptoms.  Past Medical History:  Diagnosis Date  . Anxiety   . Chronic benign neutropenia (HCC)   . Diabetes mellitus without complication (HCC)   . Severe major depression [facility])     Patient Active Problem List   Diagnosis Date Noted  . Migraine 11/13/2015  . Abdominal pain, left upper quadrant 09/11/2015  . Nausea with vomiting 09/11/2015  . GAD (generalized anxiety disorder) 10/11/2014  . Panic attacks 10/11/2014  . MDD (major depressive disorder), recurrent episode, severe (HCC) 10/10/2014  . Diabetic ketoacidosis without coma associated with type 1 diabetes mellitus (HCC)   . SOB (shortness of breath)   . DKA (diabetic ketoacidoses) (HCC) 07/27/2014    Past Surgical History:  Procedure Laterality Date  . KNEE SURGERY    . TONSILLECTOMY    . TONSILLECTOMY AND ADENOIDECTOMY    . WISDOM TOOTH EXTRACTION      OB History    No data available       Home Medications    Prior to Admission medications   Medication Sig Start Date End Date Taking? Authorizing Provider  benzonatate (TESSALON PERLES) 100 MG capsule Take 1 capsule (100 mg total) by  mouth 3 (three) times daily as needed for cough. 12/23/16   Ileana Chalupa Lyn, MD  busPIRone (BUSPAR) 10 MG tablet Take 1 tablet (10 mg total) by mouth 3 (three) times daily. 11/13/16   Sheliah Hatch, MD  clonazePAM (KLONOPIN) 1 MG tablet Take 1 tablet (1 mg total) by mouth 2 (two) times daily as needed. for anxiety 11/13/16   Sheliah Hatch, MD  GLUCAGEN HYPOKIT 1 MG SOLR injection Inject 1 application into the skin once. FOR HYPOGLYCEMIA 09/06/14   [provider]  guaiFENesin-codeine 100-10 MG/5ML syrup Take 5 mLs by mouth every 6 (six) hours as needed for cough. 12/23/16   Amire Gossen Lyn, MD  HYDROcodone-acetaminophen (NORCO) 5-325 MG tablet Take 1 tablet by mouth every 6 (six) hours as needed for severe pain. Patient not taking: Reported on 11/13/2016 04/10/16   Sheliah Hatch, MD  insulin aspart (NOVOLOG) 100 UNIT/ML injection Inject into the skin 3 (three) times daily before meals. Through insulin pump, varies    [provider]  omeprazole (PRILOSEC) 40 MG capsule TK 1 C PO QD 10/29/15   [provider]  oxyCODONE-acetaminophen (PERCOCET/ROXICET) 5-325 MG tablet Take 2 tablets by mouth every 6 (six) hours as needed for severe pain. Patient not taking: Reported on 11/13/2016 07/03/16   Russo, [place] N, PA-C  promethazine (PHENERGAN) 12.5 MG tablet Take 2 tablets (  25 mg total) by mouth every 8 (eight) hours as needed for nausea or vomiting. Patient not taking: Reported on 11/13/2016 09/17/15   Audry Pili, PA-C  propranolol ER (INDERAL LA) 80 MG 24 hr capsule Take 1 capsule (80 mg total) by mouth daily. Patient not taking: Reported on 11/13/2016 11/13/15   Sheliah Hatch, MD  sertraline (ZOLOFT) 100 MG tablet Take 1 tablet (100 mg total) by mouth daily. 11/13/16   Sheliah Hatch, MD  SUMAtriptan (IMITREX) 100 MG tablet TAKE 1/2 TABLET(50 MG) BY MOUTH EVERY 2 HOURS AS NEEDED FOR MIGRAINE. MAY REPEAT IN 2 HOURS IF HEADACHE PERSISTS OR  RECURS Patient not taking: Reported on 11/13/2016 11/02/16   Sheliah Hatch, MD    Family History Family History  Problem Relation Age of Onset  . Diabetes Mellitus II Maternal Grandmother   . Diabetes Mellitus II Paternal Grandfather   . Leukemia Paternal Grandfather   . Hypertension Mother     Social History Social History  Substance Use Topics  . Smoking status: Never Smoker  . Smokeless tobacco: Never Used  . Alcohol use No     Allergies   Other; Paxil [paroxetine]; and Prednisone   Review of Systems Review of Systems  Constitutional: Positive for fatigue and fever. Negative for activity change.  Respiratory: Positive for cough. Negative for shortness of breath.   Cardiovascular: Negative for chest pain.  Gastrointestinal: Negative for abdominal pain.     Physical Exam Updated Vital Signs BP 110/75 (BP Location: Left Arm)   Pulse 95   Temp 98.6 F (37 C) (Oral)   Resp 20   Ht  (1.651 m)   Wt 83.9 kg (185 lb)   LMP 12/17/2016   SpO2 98%   BMI 30.79 kg/m   Physical Exam  Constitutional: She is oriented to person, place, and time. She appears well-developed and well-nourished.  HENT:  Head: Normocephalic and atraumatic.  Eyes: Right eye exhibits no discharge. Left eye exhibits no discharge.  Cardiovascular: Normal rate, regular rhythm and normal heart sounds.   No murmur heard. Pulmonary/Chest: Effort normal and breath sounds normal. She has no wheezes. She has no rales.  Abdominal: Soft. She exhibits no distension. There is no tenderness.  Neurological: She is oriented to person, place, and time.  Skin: Skin is warm and dry. She is not diaphoretic.  Psychiatric: She has a normal mood and affect.  Nursing note and vitals reviewed.    ED Treatments / Results  Labs (all labs ordered are listed, but only abnormal results are displayed) Labs Reviewed  COMPREHENSIVE METABOLIC PANEL - Abnormal; Notable for the following:       Result Value    Sodium 134 (*)    Glucose, Bld 300 (*)    Calcium 8.7 (*)    All other components within normal limits  CBC WITH DIFFERENTIAL/PLATELET    EKG  EKG Interpretation None       Radiology Dg Chest 2 View  Result Date: 12/23/2016 CLINICAL DATA:  Cough, sternal chest pain, headache, nausea, intermittent fever, diagnosed with influenza a and B 2 days ago, diabetes mellitus EXAM: CHEST  2 VIEW COMPARISON:  07/28/2014 FINDINGS: Normal heart size, mediastinal contours, and pulmonary vascularity. Lungs clear. No pulmonary infiltrate, pleural effusion, or pneumothorax. Bones unremarkable. IMPRESSION: No acute abnormalities. Electronically Signed   By: Ulyses Southward M.D.   On: 12/23/2016 18:11    Procedures Procedures (including critical care time)  Medications Ordered in ED Medications  sodium chloride  0.9 % bolus 1,000 mL (1,000 mLs Intravenous New Bag/Given 12/23/16 1805)  ketorolac (TORADOL) 30 MG/ML injection 30 mg (30 mg Intravenous Given 12/23/16 1810)     Initial Impression / Assessment and Plan / ED Course  I have reviewed the triage vital signs and the nursing notes.  Pertinent labs & imaging results that were available during my care of the patient were reviewed by me and considered in my medical decision making (see chart for details).     Well appearing 34 yaer old female presentign with cough and flu like symtpoms in the setting of + flu. We will ensure the patient doesn't have an overlying bacterial infection. However given her normal vital signs and normal physical exam is likely this is just part of the flu. We'll give her fluids to make her feel better.  CXR normal. Labs reassuring. Will have her follow up with PCP, gave cough medicine. Taking PO normalluy, normal vitasl.   Final Clinical Impressions(s) / ED Diagnoses   Final diagnoses:  Cough    New Prescriptions New Prescriptions   BENZONATATE (TESSALON PERLES) 100 MG CAPSULE    Take 1 capsule (100 mg total) by  mouth 3 (three) times daily as needed for cough.   GUAIFENESIN-CODEINE 100-10 MG/5ML SYRUP    Take 5 mLs by mouth every 6 (six) hours as needed for cough.     Abelino Derrick, MD 12/23/16 [phone]

## 2016-12-30 ENCOUNTER — Other Ambulatory Visit: Payer: Self-pay | Admitting: Family Medicine

## 2017-01-11 ENCOUNTER — Ambulatory Visit: Payer: BLUE CROSS/BLUE SHIELD | Admitting: Licensed Clinical Social Worker

## 2017-01-21 ENCOUNTER — Ambulatory Visit: Payer: BLUE CROSS/BLUE SHIELD | Admitting: Licensed Clinical Social Worker

## 2017-01-21 ENCOUNTER — Encounter (HOSPITAL_BASED_OUTPATIENT_CLINIC_OR_DEPARTMENT_OTHER): Payer: Self-pay

## 2017-01-21 ENCOUNTER — Emergency Department (HOSPITAL_BASED_OUTPATIENT_CLINIC_OR_DEPARTMENT_OTHER)
Admission: EM | Admit: 2017-01-21 | Discharge: 2017-01-21 | Disposition: A | Discharge status: Home / Self Care | Payer: Self-pay | Attending: Emergency Medicine | Admitting: Emergency Medicine

## 2017-01-21 ENCOUNTER — Emergency Department (HOSPITAL_BASED_OUTPATIENT_CLINIC_OR_DEPARTMENT_OTHER): Payer: Self-pay

## 2017-01-21 ENCOUNTER — Other Ambulatory Visit: Payer: Self-pay

## 2017-01-21 DIAGNOSIS — K5900 Constipation, unspecified: Secondary | ICD-10-CM | POA: Insufficient documentation

## 2017-01-21 DIAGNOSIS — Z794 Long term (current) use of insulin: Secondary | ICD-10-CM | POA: Insufficient documentation

## 2017-01-21 DIAGNOSIS — R1084 Generalized abdominal pain: Secondary | ICD-10-CM

## 2017-01-21 DIAGNOSIS — Z79899 Other long term (current) drug therapy: Secondary | ICD-10-CM | POA: Insufficient documentation

## 2017-01-21 DIAGNOSIS — E1010 Type 1 diabetes mellitus with ketoacidosis without coma: Secondary | ICD-10-CM | POA: Insufficient documentation

## 2017-01-21 LAB — URINALYSIS, ROUTINE W REFLEX MICROSCOPIC
Bilirubin Urine: NEGATIVE
GLUCOSE, UA: NEGATIVE mg/dL
HGB URINE DIPSTICK: NEGATIVE
KETONES UR: NEGATIVE mg/dL
LEUKOCYTES UA: NEGATIVE
Nitrite: NEGATIVE
PH: 7.5 (ref 5.0–8.0)
Protein, ur: NEGATIVE mg/dL
Specific Gravity, Urine: 1.02 (ref 1.005–1.030)

## 2017-01-21 LAB — LIPASE, BLOOD: LIPASE: 18 U/L (ref 11–51)

## 2017-01-21 LAB — COMPREHENSIVE METABOLIC PANEL
ALBUMIN: 4.1 g/dL (ref 3.5–5.0)
ALK PHOS: 51 U/L (ref 38–126)
ALT: 16 U/L (ref 14–54)
AST: 18 U/L (ref 15–41)
Anion gap: 8 (ref 5–15)
BILIRUBIN TOTAL: 0.3 mg/dL (ref 0.3–1.2)
BUN: 12 mg/dL (ref 6–20)
CALCIUM: 8.7 mg/dL — AB (ref 8.9–10.3)
CO2: 27 mmol/L (ref 22–32)
Chloride: 102 mmol/L (ref 101–111)
Creatinine, Ser: 0.69 mg/dL (ref 0.44–1.00)
GFR calc Af Amer: >60 mL/min (ref >60)
GLUCOSE: 119 mg/dL — AB (ref 65–99)
Potassium: 3.5 mmol/L (ref 3.5–5.1)
Sodium: 137 mmol/L (ref 135–145)
TOTAL PROTEIN: 7 g/dL (ref 6.5–8.1)

## 2017-01-21 LAB — CBC WITH DIFFERENTIAL/PLATELET
BASOS ABS: 0 10*3/uL (ref 0.0–0.1)
BASOS PCT: 1 %
EOS ABS: 0.1 10*3/uL (ref 0.0–0.7)
EOS PCT: 1 %
HCT: 36.6 % (ref 36.0–46.0)
Hemoglobin: 12.3 g/dL (ref 12.0–15.0)
Lymphocytes Relative: 43 %
Lymphs Abs: 2.3 10*3/uL (ref 0.7–4.0)
MCH: 30.4 pg (ref 26.0–34.0)
MCHC: 33.6 g/dL (ref 30.0–36.0)
MCV: 90.6 fL (ref 78.0–100.0)
MONO ABS: 0.4 10*3/uL (ref 0.1–1.0)
MONOS PCT: 8 %
Neutro Abs: 2.5 10*3/uL (ref 1.7–7.7)
Neutrophils Relative %: 47 %
PLATELETS: 176 10*3/uL (ref 150–400)
RBC: 4.04 MIL/uL (ref 3.87–5.11)
RDW: 13.2 % (ref 11.5–15.5)
WBC: 5.4 10*3/uL (ref 4.0–10.5)

## 2017-01-21 LAB — PREGNANCY, URINE: Preg Test, Ur: NEGATIVE

## 2017-01-21 MED ORDER — PROCHLORPERAZINE EDISYLATE 5 MG/ML IJ SOLN
10.0000 mg | Freq: Once | INTRAMUSCULAR | Status: AC
  Administered 2017-01-21: 10 mg via INTRAVENOUS
  Filled 2017-01-21: qty 2

## 2017-01-21 MED ORDER — POLYETHYLENE GLYCOL 3350 17 G PO PACK: 17.0000 g | PACK | Freq: Every day | ORAL | 0 refills | Status: DC

## 2017-01-21 MED ORDER — SODIUM CHLORIDE 0.9 % IV BOLUS (SEPSIS)
1000.0000 mL | Freq: Once | INTRAVENOUS | Status: AC
  Administered 2017-01-21: 1000 mL via INTRAVENOUS

## 2017-01-21 MED ORDER — IOPAMIDOL (ISOVUE-300) INJECTION 61%
100.0000 mL | Freq: Once | INTRAVENOUS | Status: AC | PRN
  Administered 2017-01-21: 100 mL via INTRAVENOUS

## 2017-01-21 MED ORDER — KETOROLAC TROMETHAMINE 30 MG/ML IJ SOLN
30.0000 mg | Freq: Once | INTRAMUSCULAR | Status: AC
  Administered 2017-01-21: 30 mg via INTRAVENOUS
  Filled 2017-01-21: qty 1

## 2017-01-21 MED ORDER — DIPHENHYDRAMINE HCL 50 MG/ML IJ SOLN
25.0000 mg | Freq: Once | INTRAMUSCULAR | Status: AC
  Administered 2017-01-21: 25 mg via INTRAVENOUS
  Filled 2017-01-21: qty 1

## 2017-01-21 NOTE — ED Provider Notes (Signed)
[facility] EMERGENCY DEPARTMENT Provider Note   CSN: [id number] Arrival date & time: 01/21/17  1459     History   Chief Complaint Chief Complaint  Patient presents with  . Abdominal Pain    HPI Michelle Riley is a 31 y.o. female.  The history is provided by the patient. No language interpreter was used.  Abdominal Pain   This is a new problem. The current episode started more than 2 days ago. The problem occurs constantly. The problem has not changed since onset.The pain is located in the RUQ and RLQ. The pain is moderate. Associated symptoms include anorexia, nausea and headaches. Pertinent negatives include diarrhea, vomiting and dysuria. The symptoms are aggravated by palpation.    Past Medical History:  Diagnosis Date  . Anxiety   . Chronic benign neutropenia (HCC)   . Diabetes mellitus without complication (HCC)   . Severe major depression [facility](HCC)     Patient Active Problem List   Diagnosis Date Noted  . Migraine 11/13/2015  . Abdominal pain, left upper quadrant 09/11/2015  . Nausea with vomiting 09/11/2015  . GAD (generalized anxiety disorder) 10/11/2014  . Panic attacks 10/11/2014  . MDD (major depressive disorder), recurrent episode, severe (HCC) 10/10/2014  . Diabetic ketoacidosis without coma associated with type 1 diabetes mellitus (HCC)   . SOB (shortness of breath)   . DKA (diabetic ketoacidoses) (HCC) 07/27/2014    Past Surgical History:  Procedure Laterality Date  . KNEE SURGERY    . TONSILLECTOMY    . TONSILLECTOMY AND ADENOIDECTOMY    . WISDOM TOOTH EXTRACTION      OB History    No data available       Home Medications    Prior to Admission medications   Medication Sig Start Date End Date Taking? Authorizing Provider  busPIRone (BUSPAR) 10 MG tablet Take 1 tablet (10 mg total) by mouth 3 (three) times daily. 11/13/16   Sheliah Hatchabori, Katherine E, MD  clonazePAM (KLONOPIN) 1 MG tablet Take 1 tablet (1 mg total) by mouth 2 (two) times  daily as needed. for anxiety 11/13/16   Sheliah Hatchabori, Katherine E, MD  GLUCAGEN HYPOKIT 1 MG SOLR injection Inject 1 application into the skin once. FOR HYPOGLYCEMIA 09/06/14   [provider]  insulin aspart (NOVOLOG) 100 UNIT/ML injection Inject into the skin 3 (three) times daily before meals. Through insulin pump, varies    [provider]  omeprazole (PRILOSEC) 40 MG capsule TK 1 C PO QD 10/29/15   [provider]  sertraline (ZOLOFT) 100 MG tablet Take 1 tablet (100 mg total) by mouth daily. 11/13/16   Sheliah Hatchabori, Katherine E, MD  SUMAtriptan (IMITREX) 100 MG tablet TAKE 1/2 TABLET BY MOUTH EVERY 2 HOURS AS NEEDED FOR MIGRAINE. MAY REPEAT IN 2 HOURS IF HEADACHE PERSISTS OR RECURS 12/31/16   Sheliah Hatchabori, Katherine E, MD    Family History Family History  Problem Relation Age of Onset  . Diabetes Mellitus II Maternal Grandmother   . Diabetes Mellitus II Paternal Grandfather   . Leukemia Paternal Grandfather   . Hypertension Mother     Social History Social History   Tobacco Use  . Smoking status: Never Smoker  . Smokeless tobacco: Never Used  Substance Use Topics  . Alcohol use: No  . Drug use: No     Allergies   Other; Paxil [paroxetine]; and Prednisone   Review of Systems Review of Systems  Gastrointestinal: Positive for abdominal pain, anorexia and nausea. Negative for diarrhea and  vomiting.  Genitourinary: Negative for dysuria.  Neurological: Positive for headaches.  All other systems reviewed and are negative.    Physical Exam Updated Vital Signs BP 111/82 (BP Location: Right Arm)   Pulse 66   Temp 98.9 F (37.2 C) (Oral)   Resp 18   Ht 5\' 5"  (1.651 m)   Wt 83.5 kg (184 lb)   SpO2 100%   BMI 30.62 kg/m   Physical Exam  Constitutional: She is oriented to person, place, and time. She appears well-developed and well-nourished.  HENT:  Head: Atraumatic.  Eyes: Conjunctivae are normal.  Neck: Neck supple.  Cardiovascular: Normal rate and regular  rhythm.  Pulmonary/Chest: Effort normal and breath sounds normal.  Abdominal: Soft. She exhibits no distension. There is tenderness in the right upper quadrant and right lower quadrant. There is guarding and tenderness at McBurney's point.  Musculoskeletal: Normal range of motion. She exhibits no edema.  Neurological: She is alert and oriented to person, place, and time.  Skin: Skin is warm and dry.  Psychiatric: She has a normal mood and affect.  Nursing note and vitals reviewed.    ED Treatments / Results  Labs (all labs ordered are listed, but only abnormal results are displayed) Labs Reviewed  COMPREHENSIVE METABOLIC PANEL - Abnormal; Notable for the following components:      Result Value   Glucose, Bld 119 (*)    Calcium 8.7 (*)    All other components within normal limits  URINALYSIS, ROUTINE W REFLEX MICROSCOPIC  PREGNANCY, URINE  LIPASE, BLOOD  CBC WITH DIFFERENTIAL/PLATELET    EKG  EKG Interpretation None       Radiology Ct Abdomen Pelvis W Contrast  Result Date: 01/21/2017 CLINICAL DATA:  Right lower quadrant and flank pain with chills, sweats and nausea x1 week. EXAM: CT ABDOMEN AND PELVIS WITH CONTRAST TECHNIQUE: Multidetector CT imaging of the abdomen and pelvis was performed using the standard protocol following bolus administration of intravenous contrast. CONTRAST:  ISOVUE-300 IOPAMIDOL (ISOVUE-300) INJECTION 61% COMPARISON:  09/12/2015 FINDINGS: LOWER CHEST: Lung bases are clear. Included heart size is normal. No pericardial effusion. Minimal right basilar atelectasis PICC HEPATOBILIARY: Liver and gallbladder are normal. PANCREAS: Normal. SPLEEN: Normal size without focal mass. ADRENALS/URINARY TRACT: Kidneys are orthotopic, demonstrating symmetric enhancement. No nephrolithiasis, hydronephrosis or solid renal masses. Urinary bladder is partially distended and unremarkable. Normal adrenal glands. STOMACH/BOWEL: The stomach is physiologically distended with  enteric contrast. There is normal small bowel rotation without obstruction. A large amount of stool is seen retained within the colon from cecum through splenic flexure. There is moderate diffuse thickening of the transverse proximal descending colon believed to represent under distention and less likely colitis. A larger amount of fecal retention is seen within the rectosigmoid. VASCULAR/LYMPHATIC: Aortoiliac vessels are normal in course and caliber. No lymphadenopathy by CT size criteria. REPRODUCTIVE: Intrauterine device is seen within the uterus. No adnexal mass. OTHER: No intraperitoneal free fluid or free air. MUSCULOSKELETAL: Nonacute. IMPRESSION: Large stool burden within the colon consistent constipation. Mild diffuse thickened appearance of the transverse and proximal descending colon is believed to be secondary to under distention. Colitis is believed less likely. No pericolonic inflammation is noted. Otherwise negative exam. Electronically Signed   By: Tollie Eth M.D.   On: 01/21/2017 19:56    Procedures Procedures (including critical care time)  Medications Ordered in ED Medications  diphenhydrAMINE (BENADRYL) injection 25 mg (not administered)  prochlorperazine (COMPAZINE) injection 10 mg (not administered)     Initial Impression /  Assessment and Plan / ED Course  I have reviewed the triage vital signs and the nursing notes.  Pertinent labs & imaging results that were available during my care of the patient were reviewed by me and considered in my medical decision making (see chart for details).     Patient is nontoxic, nonseptic appearing, in no apparent distress.  Patient's pain and other symptoms adequately managed in emergency department.  Fluid bolus given.  Labs, imaging and vitals reviewed.  Patient does not meet the SIRS or Sepsis criteria.  On repeat exam patient does not have a surgical abdomen and there are no peritoneal signs.  No indication of appendicitis, bowel  obstruction, bowel perforation, cholecystitis, diverticulitis. Patient discharged home with symptomatic treatment and given strict instructions for follow-up with their primary care physician.  I have also discussed reasons to return immediately to the ER.  Patient expresses understanding and agrees with plan.    Final Clinical Impressions(s) / ED Diagnoses   Final diagnoses:  Generalized abdominal pain  Constipation, unspecified constipation type    ED Discharge Orders        Ordered    polyethylene glycol (MIRALAX / GLYCOLAX) packet  Daily     01/21/17 2135       Felicie MornSmith, Sandy Haye, NP 01/22/17 [id number]    Pickering, Nathan, MD 01/22/17 [phone]

## 2017-01-21 NOTE — ED Notes (Signed)
Patient transported to CT 

## 2017-01-21 NOTE — ED Triage Notes (Addendum)
C/o RLQ pain, right flank, chills, sweats, nausea, fatigue x 1 week-[facility]-steady gait

## 2017-01-22 ENCOUNTER — Ambulatory Visit: Payer: Self-pay | Admitting: Physician Assistant

## 2017-01-22 DIAGNOSIS — Z0289 Encounter for other administrative examinations: Secondary | ICD-10-CM

## 2017-01-25 ENCOUNTER — Ambulatory Visit: Payer: BLUE CROSS/BLUE SHIELD | Admitting: Licensed Clinical Social Worker

## 2017-02-08 ENCOUNTER — Ambulatory Visit: Payer: Self-pay | Admitting: Licensed Clinical Social Worker

## 2017-02-15 ENCOUNTER — Other Ambulatory Visit: Payer: Self-pay | Admitting: Family Medicine

## 2017-02-15 NOTE — Telephone Encounter (Signed)
Last OV 11/13/16 Clonazepam last filled 11/13/16 #60 with 0

## 2017-02-15 NOTE — Telephone Encounter (Signed)
Medication filled to pharmacy as requested.   

## 2017-02-22 ENCOUNTER — Ambulatory Visit: Payer: Self-pay | Admitting: Licensed Clinical Social Worker

## 2017-03-05 ENCOUNTER — Ambulatory Visit (INDEPENDENT_AMBULATORY_CARE_PROVIDER_SITE_OTHER): Payer: Self-pay

## 2017-03-05 ENCOUNTER — Ambulatory Visit: Payer: Self-pay | Admitting: Family Medicine

## 2017-03-05 ENCOUNTER — Encounter: Payer: Self-pay | Admitting: Family Medicine

## 2017-03-05 VITALS — BP 120/90 | HR 75 | Temp 98.6°F | Ht 65.0 in | Wt 186.4 lb

## 2017-03-05 DIAGNOSIS — R52 Pain, unspecified: Secondary | ICD-10-CM

## 2017-03-05 DIAGNOSIS — E109 Type 1 diabetes mellitus without complications: Secondary | ICD-10-CM

## 2017-03-05 DIAGNOSIS — R059 Cough, unspecified: Secondary | ICD-10-CM

## 2017-03-05 DIAGNOSIS — J189 Pneumonia, unspecified organism: Secondary | ICD-10-CM

## 2017-03-05 DIAGNOSIS — R05 Cough: Secondary | ICD-10-CM

## 2017-03-05 LAB — COMPREHENSIVE METABOLIC PANEL
ALT: 13 U/L (ref 0–35)
AST: 14 U/L (ref 0–37)
Albumin: 4.2 g/dL (ref 3.5–5.2)
Alkaline Phosphatase: 50 U/L (ref 39–117)
BILIRUBIN TOTAL: 0.4 mg/dL (ref 0.2–1.2)
BUN: 12 mg/dL (ref 6–23)
CO2: 28 meq/L (ref 19–32)
Calcium: 8.9 mg/dL (ref 8.4–10.5)
Chloride: 104 mEq/L (ref 96–112)
Creatinine, Ser: 0.77 mg/dL (ref 0.40–1.20)
GFR: 92.33 mL/min (ref >60.00)
GLUCOSE: 104 mg/dL — AB (ref 70–99)
Potassium: 3.6 mEq/L (ref 3.5–5.1)
SODIUM: 140 meq/L (ref 135–145)
Total Protein: 6.9 g/dL (ref 6.0–8.3)

## 2017-03-05 LAB — CBC WITH DIFFERENTIAL/PLATELET
BASOS ABS: 0.1 10*3/uL (ref 0.0–0.1)
Basophils Relative: 1.2 % (ref 0.0–3.0)
EOS ABS: 0.2 10*3/uL (ref 0.0–0.7)
Eosinophils Relative: 3.5 % (ref 0.0–5.0)
HCT: 39.3 % (ref 36.0–46.0)
Hemoglobin: 12.8 g/dL (ref 12.0–15.0)
LYMPHS ABS: 2.9 10*3/uL (ref 0.7–4.0)
Lymphocytes Relative: 45.5 % (ref 12.0–46.0)
MCHC: 32.6 g/dL (ref 30.0–36.0)
MCV: 94.6 fl (ref 78.0–100.0)
MONO ABS: 0.5 10*3/uL (ref 0.1–1.0)
MONOS PCT: 7.5 % (ref 3.0–12.0)
NEUTROS ABS: 2.7 10*3/uL (ref 1.4–7.7)
NEUTROS PCT: 42.3 % — AB (ref 43.0–77.0)
PLATELETS: 160 10*3/uL (ref 150.0–400.0)
RBC: 4.16 Mil/uL (ref 3.87–5.11)
RDW: 13.8 % (ref 11.5–15.5)
WBC: 6.4 10*3/uL (ref 4.0–10.5)

## 2017-03-05 LAB — POCT URINALYSIS DIPSTICK
Bilirubin, UA: NEGATIVE
Blood, UA: NEGATIVE
Glucose, UA: NEGATIVE
Ketones, UA: NEGATIVE
LEUKOCYTES UA: NEGATIVE
NITRITE UA: NEGATIVE
PROTEIN UA: NEGATIVE
Spec Grav, UA: 1.015 (ref 1.010–1.025)
Urobilinogen, UA: 0.2 E.U./dL
pH, UA: 6.5 (ref 5.0–8.0)

## 2017-03-05 LAB — POC INFLUENZA A&B (BINAX/QUICKVUE)
INFLUENZA A, POC: NEGATIVE
Influenza B, POC: NEGATIVE

## 2017-03-05 MED ORDER — ALBUTEROL SULFATE (2.5 MG/3ML) 0.083% IN NEBU
2.5000 mg | INHALATION_SOLUTION | Freq: Once | RESPIRATORY_TRACT | Status: AC
  Administered 2017-03-05: 2.5 mg via RESPIRATORY_TRACT

## 2017-03-05 MED ORDER — HYDROCOD POLST-CPM POLST ER 10-8 MG/5ML PO SUER: 5.0000 mL | Freq: Two times a day (BID) | ORAL | 0 refills | Status: DC | PRN

## 2017-03-05 NOTE — Progress Notes (Signed)
Subjective  CC:  Chief Complaint  Patient presents with  . Cough    Dx at urgent care on Tues with Bronchitis and Pneumonia, cough worse and pain on left side chest   . Shortness of Breath  . Dizziness    HPI: Michelle Riley is a 31 y.o. female who presents to the office today to address the problems listed above in the chief complaint.  31 year old type I diabetic presents due to worsening cough and shortness of breath.  Reports [facility] and diagnosed with possible community-acquired pneumonia versus bronchitis.  She complains of cough, pain in chest with deep breathing, lightheadedness, anorexia, hyper and hypoglycemia, and weakness.  She was treated with doxycycline, albuterol inhaler and Tessalon Perles however nothing seems to be improving.  She reports his cough is keeping her up at night.  She feels short of breath upon doing small tasks.  She worries that she has the flu because she has myalgias.  She was diagnosed with the flu back in October and felt similar.  She also was in the emergency room in November for a cough related illness.  She is followed by endocrinology who manages her diabetic pump.  Sugars have ranged between 60 and 300 over the last 48 hours she had nothing to eat today, her sugar was 300.  She denies abdominal pain no recent history of DKA.  I reviewed the patients updated PMH, FH, and SocHx.    Patient Active Problem List   Diagnosis Date Noted  . Migraine 11/13/2015  . Abdominal pain, left upper quadrant 09/11/2015  . Nausea with vomiting 09/11/2015  . GAD (generalized anxiety disorder) 10/11/2014  . Panic attacks 10/11/2014  . MDD (major depressive disorder), recurrent episode, severe (HCC) 10/10/2014  . Diabetic ketoacidosis without coma associated with type 1 diabetes mellitus (HCC)   . SOB (shortness of breath)   . DKA (diabetic ketoacidoses) (HCC) 07/27/2014   Current Meds  Medication Sig  . albuterol (PROVENTIL  HFA;VENTOLIN HFA) 108 (90 Base) MCG/ACT inhaler Inhale 2 puffs into the lungs every 6 (six) hours as needed for wheezing or shortness of breath.  . benzonatate (TESSALON) 200 MG capsule Take 200 mg by mouth 3 (three) times daily as needed for cough.  . busPIRone (BUSPAR) 10 MG tablet Take 1 tablet (10 mg total) by mouth 3 (three) times daily.  . clonazePAM (KLONOPIN) 1 MG tablet TAKE 1 TABLET BY MOUTH TWICE DAILY AS NEEDED FOR ANXIETY  . doxycycline (VIBRA-TABS) 100 MG tablet Take 100 mg by mouth 2 (two) times daily.  Marland Kitchen GLUCAGEN HYPOKIT 1 MG SOLR injection Inject 1 application into the skin once. FOR HYPOGLYCEMIA  . insulin aspart (NOVOLOG) 100 UNIT/ML injection Inject into the skin 3 (three) times daily before meals. Through insulin pump, varies  . sertraline (ZOLOFT) 100 MG tablet Take 1 tablet (100 mg total) by mouth daily.  . SUMAtriptan (IMITREX) 100 MG tablet TAKE 1/2 TABLET BY MOUTH EVERY 2 HOURS AS NEEDED FOR MIGRAINE. MAY REPEAT IN 2 HOURS IF HEADACHE PERSISTS OR RECURS    Allergies: Patient is allergic to other; paxil [paroxetine]; and prednisone. Family History: Patient family history includes Diabetes Mellitus II in her maternal grandmother and paternal grandfather; Hypertension in her mother; Leukemia in her paternal grandfather. Social History:  Patient  reports that  has never smoked. she has never used smokeless tobacco. She reports that she does not drink alcohol or use drugs.  Review of Systems:  Constitutional: Negative for fever malaise or anorexia Cardiovascular: negative for chest pain Respiratory: negative for SOB or persistent cough Gastrointestinal: negative for abdominal pain  Objective  Vitals: BP 120/90 (BP Location: Left Arm, Patient Position: Sitting, Cuff Size: Normal)   Pulse 75   Temp 98.6 F (37 C) (Oral)   Ht 5\' 5"  (1.651 m)   Wt 186 lb 6.1 oz (84.5 kg)   LMP 03/02/2017   SpO2 95%   BMI 31.02 kg/m  General: no acute distress , A&Ox3, no respiratory  distress.  Appears comfortable.  Speaks in full sentences, forced harsh cough present HEENT: PEERL, conjunctiva normal, Oropharynx moist,neck is supple, no lymphadenopathy Cardiovascular:  RRR without murmur or gallop.  Respiratory:  Good breath sounds bilaterally, CTAB with normal respiratory effort, no wheezing Benign abdomen Skin:  Warm, no rashes Office Visit on 03/05/2017  Component Date Value Ref Range Status  . Color, UA 03/05/2017 yellow   Final  . Clarity, UA 03/05/2017 clear   Final  . Glucose, UA 03/05/2017 negative   Final  . Bilirubin, UA 03/05/2017 negative   Final  . Ketones, UA 03/05/2017 negative   Final  . Spec Grav, UA 03/05/2017 1.015  1.010 - 1.025 Final  . Blood, UA 03/05/2017 negative   Final  . pH, UA 03/05/2017 6.5  5.0 - 8.0 Final  . Protein, UA 03/05/2017 negative   Final  . Urobilinogen, UA 03/05/2017 0.2  0.2 or 1.0 E.U./dL Final  . Nitrite, UA 16/10/960412/21/2018 negative   Final  . Leukocytes, UA 03/05/2017 Negative  Negative Final  . Influenza A, POC 03/05/2017 Negative  Negative Final  . Influenza B, POC 03/05/2017 Negative  Negative Final    Assessment  1. Community acquired pneumonia, unspecified laterality   2. Type 1 diabetes mellitus without complications (HCC)   3. Cough   4. Generalized body aches      Plan   Respiratory tract infection: Bronchitis versus pneumonia.  No rales heard on exam.  Patient is not tolerating her symptoms well.  Check CBC, CMP, repeat chest x-ray, continue antibiotics and add Tussionex for cough.  Educated on keeping hydrated.  Urine is negative for ketones although she is at risk for developing DKA.  She will monitor her sugars closely.  Reassured that her respiratory status is stable.  We did walk her in the hall and her oxygenation remained stable throughout the entire course.  She subjectively feels short of breath.  Follow up: Return in about 1 week (around 03/12/2017) for recheck.    Commons side effects, risks,  benefits, and alternatives for medications and treatment plan prescribed today were discussed, and the patient expressed understanding of the given instructions. Patient is instructed to call or message via MyChart if he/she has any questions or concerns regarding our treatment plan. No barriers to understanding were identified. We discussed Red Flag symptoms and signs in detail. Patient expressed understanding regarding what to do in case of urgent or emergency type symptoms.   Medication list was reconciled, printed and provided to the patient in AVS. Patient instructions and summary information was reviewed with the patient as documented in the AVS. This note was prepared with assistance of Dragon voice recognition software. Occasional wrong-word or sound-a-like substitutions may have occurred due to the inherent limitations of voice recognition software  Orders Placed This Encounter  Procedures  . DG Chest 2 View  . CBC with Differential/Platelet  . Comprehensive metabolic panel  . POCT urinalysis dipstick  . POC Influenza  A&B(BINAX/QUICKVUE)   Meds ordered this encounter  Medications  . albuterol (PROVENTIL) (2.5 MG/3ML) 0.083% nebulizer solution 2.5 mg  . chlorpheniramine-HYDROcodone (TUSSIONEX PENNKINETIC ER) 10-8 MG/5ML SUER    Sig: Take 5 mLs by mouth every 12 (twelve) hours as needed for cough.    Dispense:  140 mL    Refill:  0

## 2017-03-05 NOTE — Patient Instructions (Addendum)
Please schedule a follow up visit w/in 1 week with Dr. Beverely Lowabori or myself for recheck.  Your respiratory status is currently stable. We will check the chest xray to confirm.  Rest and complete your medications  Please go to our [facility] office to get your xrays done. You can walk in M-F between 8am and 5pm. Tell them you are there for xrays ordered by me. They will send me the results, then I will let you know the results with instructions.   Address: [address], [place], [place] [phone]  (office sits at [address] at [address]; from here, turn left onto [address] take to [address], turn right and go for a mile or so, office will be on left across form [address] )  Monitor your sugars closely.  Stay hydrated!

## 2017-03-10 ENCOUNTER — Encounter: Payer: Self-pay | Admitting: Family Medicine

## 2017-03-10 ENCOUNTER — Ambulatory Visit: Payer: Self-pay | Admitting: Family Medicine

## 2017-03-10 VITALS — BP 110/70 | HR 92 | Temp 98.6°F | Resp 16 | Ht 65.0 in | Wt 188.0 lb

## 2017-03-10 DIAGNOSIS — J40 Bronchitis, not specified as acute or chronic: Secondary | ICD-10-CM

## 2017-03-10 MED ORDER — PREDNISONE 10 MG PO TABS: ORAL_TABLET | ORAL | 0 refills | Status: DC

## 2017-03-10 NOTE — Patient Instructions (Signed)
Follow up as needed or as scheduled Start the Prednisone as directed.  Take w/ food and watch sugars closely Continue the albuterol and cough meds as needed REST! Call with any questions or concerns Hang in there! Happy New Year!

## 2017-03-10 NOTE — Progress Notes (Signed)
   Subjective:    Patient ID: Michelle KraussJessica D Gut, female    DOB: 30-May-1985, 31 y.o.   MRN: [id number]  HPI Cough- [facility] and prescribed Doxy.  Had follow up on 12/21 w/ Dr Mardelle MatteAndy.  She is on albuterol- which she says is not helping, Tessalon, Tussionex.  Afebrile, O2 98%.  Pt reports aches, chills, sweats but no fever.  No known sick contacts.  + fatigue.   Review of Systems For ROS see HPI     Objective:   Physical Exam  Constitutional: She appears well-developed and well-nourished. No distress.  HENT:  Head: Normocephalic and atraumatic.  TMs normal bilaterally Mild nasal congestion Throat w/out erythema, edema, or exudate  Eyes: Conjunctivae and EOM are normal. Pupils are equal, round, and reactive to light.  Neck: Normal range of motion. Neck supple.  Cardiovascular: Normal rate, regular rhythm, normal heart sounds and intact distal pulses.  No murmur heard. Pulmonary/Chest: Effort normal and breath sounds normal. No respiratory distress. She has no wheezes.  + hacking cough  Lymphadenopathy:    She has no cervical adenopathy.  Vitals reviewed.        Assessment & Plan:  Bronchitis- pt has been taking abx since [facility]  Saw Dr Mardelle MatteAndy on 12/21 and was given cough meds (records from both encounters reviewed).  Pt continues to cough and has a very low tolerance for her symptoms.  Discussed risk/benefit ratio of starting low dose steroid taper in the setting of DM but pt is willing to take the risk and monitor sugars closely to improve her cough and shortness of breath.  Reviewed supportive care and red flags that should prompt return.  Pt expressed understanding and is in agreement w/ plan.

## 2017-03-13 ENCOUNTER — Encounter: Payer: Self-pay | Admitting: Family Medicine

## 2017-03-17 ENCOUNTER — Other Ambulatory Visit: Payer: Self-pay | Admitting: Family Medicine

## 2017-03-17 NOTE — Telephone Encounter (Signed)
Last OV 03/10/17 Clonazepam last filled 02/15/17 #60 with 0

## 2017-03-17 NOTE — Telephone Encounter (Signed)
Medication filled to pharmacy as requested.   

## 2017-03-28 ENCOUNTER — Encounter: Payer: Self-pay | Admitting: Family Medicine

## 2017-03-29 ENCOUNTER — Ambulatory Visit: Payer: BLUE CROSS/BLUE SHIELD | Admitting: Physician Assistant

## 2017-03-29 ENCOUNTER — Encounter: Payer: Self-pay | Admitting: Physician Assistant

## 2017-03-29 VITALS — BP 100/72 | HR 101 | Temp 99.2°F | Resp 16 | Ht 65.0 in | Wt 185.0 lb

## 2017-03-29 DIAGNOSIS — J019 Acute sinusitis, unspecified: Secondary | ICD-10-CM | POA: Diagnosis not present

## 2017-03-29 DIAGNOSIS — B9689 Other specified bacterial agents as the cause of diseases classified elsewhere: Secondary | ICD-10-CM

## 2017-03-29 DIAGNOSIS — J9801 Acute bronchospasm: Secondary | ICD-10-CM

## 2017-03-29 MED ORDER — HYDROCOD POLST-CPM POLST ER 10-8 MG/5ML PO SUER: 5.0000 mL | Freq: Two times a day (BID) | ORAL | 0 refills | Status: DC | PRN

## 2017-03-29 MED ORDER — AMOXICILLIN-POT CLAVULANATE 875-125 MG PO TABS: 1.0000 | ORAL_TABLET | Freq: Two times a day (BID) | ORAL | 0 refills | Status: DC

## 2017-03-29 NOTE — Progress Notes (Signed)
Patient presents to clinic today c/o sinus pressure, sinus pain, ear pain and tooth pain associated with PND. Patient feels symptoms are progressively worsening. Now with low-grade fever. Notes milder symptoms prior but were very mild until 4 days ago when they significantly worsened. Denies chest pain or SOB. Has Rx for albuterol but has not been taking. Denies recent travel or sick contact.  Past Medical History:  Diagnosis Date  . Anxiety   . Chronic benign neutropenia (HCC)   . Diabetes mellitus without complication ([place])   . Severe major depression ([place])     Current Outpatient Medications on File Prior to Visit  Medication Sig Dispense Refill  . albuterol (PROVENTIL HFA;VENTOLIN HFA) 108 (90 Base) MCG/ACT inhaler Inhale 2 puffs into the lungs every 6 (six) hours as needed for wheezing or shortness of breath.    . busPIRone (BUSPAR) 10 MG tablet Take 1 tablet (10 mg total) by mouth 3 (three) times daily. 90 tablet 3  . clonazePAM (KLONOPIN) 1 MG tablet TAKE 1 TABLET BY MOUTH TWICE DAILY AS NEEDED FOR ANXIETY 60 tablet 0  . GLUCAGEN HYPOKIT 1 MG SOLR injection Inject 1 application into the skin once. FOR HYPOGLYCEMIA  11  . insulin aspart (NOVOLOG) 100 UNIT/ML injection Inject into the skin 3 (three) times daily before meals. Through insulin pump, varies    . sertraline (ZOLOFT) 100 MG tablet Take 1 tablet (100 mg total) by mouth daily. 30 tablet 3  . SUMAtriptan (IMITREX) 100 MG tablet TAKE 1/2 TABLET BY MOUTH EVERY 2 HOURS AS NEEDED FOR MIGRAINE. MAY REPEAT IN 2 HOURS IF HEADACHE PERSISTS OR RECURS 10 tablet 0   No current facility-administered medications on file prior to visit.     Allergies  Allergen Reactions  . Other Other (See Comments)    All Steroids cause DKA  . Paxil [Paroxetine] Other (See Comments)    Suicidal tendencies (abrupt change from lexapro to paxil 10/10/14)   . Prednisone Other (See Comments)    Pt is type 1 diabetic and could go into DKA.     Family  History  Problem Relation Age of Onset  . Diabetes Mellitus II Maternal Grandmother   . Diabetes Mellitus II Paternal Grandfather   . Leukemia Paternal Grandfather   . Hypertension Mother     Social History   Socioeconomic History  . Marital status: Single    Spouse name: None  . Number of children: None  . Years of education: None  . Highest education level: None  Social Needs  . Financial resource strain: None  . Food insecurity - worry: None  . Food insecurity - inability: None  . Transportation needs - medical: None  . Transportation needs - non-medical: None  Occupational History  . None  Tobacco Use  . Smoking status: Never Smoker  . Smokeless tobacco: Never Used  Substance and Sexual Activity  . Alcohol use: No  . Drug use: No  . Sexual activity: Yes    Birth control/protection: IUD  Other Topics Concern  . None  Social History Narrative  . None   Review of Systems - See HPI.  All other ROS are negative.  BP 100/72   Pulse (!) 101   Temp 99.2 F (37.3 C) (Oral)   Resp 16   Ht 5' 5"  (1.651 m)   Wt 185 lb (83.9 kg)   LMP 03/02/2017   SpO2 98%   BMI 30.79 kg/m   Physical Exam  Constitutional: She is oriented to  person, place, and time and well-developed, well-nourished, and in no distress.  HENT:  Head: Normocephalic and atraumatic.  Right Ear: Tympanic membrane normal.  Left Ear: Tympanic membrane normal.  Nose: Mucosal edema and rhinorrhea present. Right sinus exhibits frontal sinus tenderness. Right sinus exhibits no maxillary sinus tenderness. Left sinus exhibits frontal sinus tenderness. Left sinus exhibits no maxillary sinus tenderness.  Mouth/Throat: Uvula is midline, oropharynx is clear and moist and mucous membranes are normal.  Eyes: Conjunctivae are normal.  Neck: Neck supple.  Cardiovascular: Normal rate, regular rhythm, normal heart sounds and intact distal pulses.  Pulmonary/Chest: Effort normal and breath sounds normal. No respiratory  distress. She has no wheezes. She has no rales. She exhibits no tenderness.  Neurological: She is alert and oriented to person, place, and time.  Skin: Skin is warm and dry. No rash noted.  Psychiatric: Affect normal.  Vitals reviewed.  Recent Results (from the past 2160 hour(s))  Urinalysis, Routine w reflex microscopic     Status: None   Collection Time: 01/21/17  3:05 PM  Result Value Ref Range   Color, Urine YELLOW YELLOW   APPearance CLEAR CLEAR   Specific Gravity, Urine 1.020 1.005 - 1.030   pH 7.5 5.0 - 8.0   Glucose, UA NEGATIVE NEGATIVE mg/dL   Hgb urine dipstick NEGATIVE NEGATIVE   Bilirubin Urine NEGATIVE NEGATIVE   Ketones, ur NEGATIVE NEGATIVE mg/dL   Protein, ur NEGATIVE NEGATIVE mg/dL   Nitrite NEGATIVE NEGATIVE   Leukocytes, UA NEGATIVE NEGATIVE    Comment: Microscopic not done on urines with negative protein, blood, leukocytes, nitrite, or glucose < 500 mg/dL.  Pregnancy, urine     Status: None   Collection Time: 01/21/17  3:05 PM  Result Value Ref Range   Preg Test, Ur NEGATIVE NEGATIVE    Comment:        THE SENSITIVITY OF THIS METHODOLOGY IS >20 mIU/mL.   Comprehensive metabolic panel     Status: Abnormal   Collection Time: 01/21/17  5:10 PM  Result Value Ref Range   Sodium 137 135 - 145 mmol/L   Potassium 3.5 3.5 - 5.1 mmol/L   Chloride 102 101 - 111 mmol/L   CO2 27 22 - 32 mmol/L   Glucose, Bld 119 (H) 65 - 99 mg/dL   BUN 12 6 - 20 mg/dL   Creatinine, Ser 0.69 0.44 - 1.00 mg/dL   Calcium 8.7 (L) 8.9 - 10.3 mg/dL   Total Protein 7.0 6.5 - 8.1 g/dL   Albumin 4.1 3.5 - 5.0 g/dL   AST 18 15 - 41 U/L   ALT 16 14 - 54 U/L   Alkaline Phosphatase 51 38 - 126 U/L   Total Bilirubin 0.3 0.3 - 1.2 mg/dL   GFR calc non Af Amer >60 >60 mL/min   GFR calc Af Amer >60 >60 mL/min    Comment: (NOTE) The eGFR has been calculated using the CKD EPI equation. This calculation has not been validated in all clinical situations. eGFR's persistently <60 mL/min signify  possible Chronic Kidney Disease.    Anion gap 8 5 - 15  Lipase, blood     Status: None   Collection Time: 01/21/17  5:10 PM  Result Value Ref Range   Lipase 18 11 - 51 U/L  CBC with Diff     Status: None   Collection Time: 01/21/17  5:10 PM  Result Value Ref Range   WBC 5.4 4.0 - 10.5 K/uL   RBC 4.04 3.87 - 5.11  MIL/uL   Hemoglobin 12.3 12.0 - 15.0 g/dL   HCT 36.6 36.0 - 46.0 %   MCV 90.6 78.0 - 100.0 fL   MCH 30.4 26.0 - 34.0 pg   MCHC 33.6 30.0 - 36.0 g/dL   RDW 13.2 11.5 - 15.5 %   Platelets 176 150 - 400 K/uL   Neutrophils Relative % 47 %   Neutro Abs 2.5 1.7 - 7.7 K/uL   Lymphocytes Relative 43 %   Lymphs Abs 2.3 0.7 - 4.0 K/uL   Monocytes Relative 8 %   Monocytes Absolute 0.4 0.1 - 1.0 K/uL   Eosinophils Relative 1 %   Eosinophils Absolute 0.1 0.0 - 0.7 K/uL   Basophils Relative 1 %   Basophils Absolute 0.0 0.0 - 0.1 K/uL  POCT urinalysis dipstick     Status: Normal   Collection Time: 03/05/17  3:11 PM  Result Value Ref Range   Color, UA yellow    Clarity, UA clear    Glucose, UA negative    Bilirubin, UA negative    Ketones, UA negative    Spec Grav, UA 1.015 1.010 - 1.025   Blood, UA negative    pH, UA 6.5 5.0 - 8.0   Protein, UA negative    Urobilinogen, UA 0.2 0.2 or 1.0 E.U./dL   Nitrite, UA negative    Leukocytes, UA Negative Negative   Appearance     Odor    CBC with Differential/Platelet     Status: Abnormal   Collection Time: 03/05/17  3:26 PM  Result Value Ref Range   WBC 6.4 4.0 - 10.5 K/uL   RBC 4.16 3.87 - 5.11 Mil/uL   Hemoglobin 12.8 12.0 - 15.0 g/dL   HCT 39.3 36.0 - 46.0 %   MCV 94.6 78.0 - 100.0 fl   MCHC 32.6 30.0 - 36.0 g/dL   RDW 13.8 11.5 - 15.5 %   Platelets 160.0 150.0 - 400.0 K/uL   Neutrophils Relative % 42.3 (L) 43.0 - 77.0 %   Lymphocytes Relative 45.5 12.0 - 46.0 %   Monocytes Relative 7.5 3.0 - 12.0 %   Eosinophils Relative 3.5 0.0 - 5.0 %   Basophils Relative 1.2 0.0 - 3.0 %   Neutro Abs 2.7 1.4 - 7.7 K/uL   Lymphs  Abs 2.9 0.7 - 4.0 K/uL   Monocytes Absolute 0.5 0.1 - 1.0 K/uL   Eosinophils Absolute 0.2 0.0 - 0.7 K/uL   Basophils Absolute 0.1 0.0 - 0.1 K/uL  Comprehensive metabolic panel     Status: Abnormal   Collection Time: 03/05/17  3:26 PM  Result Value Ref Range   Sodium 140 135 - 145 mEq/L   Potassium 3.6 3.5 - 5.1 mEq/L   Chloride 104 96 - 112 mEq/L   CO2 28 19 - 32 mEq/L   Glucose, Bld 104 (H) 70 - 99 mg/dL   BUN 12 6 - 23 mg/dL   Creatinine, Ser 0.77 0.40 - 1.20 mg/dL   Total Bilirubin 0.4 0.2 - 1.2 mg/dL   Alkaline Phosphatase 50 39 - 117 U/L   AST 14 0 - 37 U/L   ALT 13 0 - 35 U/L   Total Protein 6.9 6.0 - 8.3 g/dL   Albumin 4.2 3.5 - 5.2 g/dL   Calcium 8.9 8.4 - 10.5 mg/dL   GFR 92.33 >60.00 mL/min  POC Influenza A&B(BINAX/QUICKVUE)     Status: None   Collection Time: 03/05/17  3:40 PM  Result Value Ref Range   Influenza A,  POC Negative Negative   Influenza B, POC Negative Negative   Assessment/Plan: 1. Acute bacterial sinusitis Rx augmentin.  Increase fluids.  Rest.  Saline nasal spray.  Probiotic.  Mucinex as directed.  Humidifier in bedroom. Tussionex as directed.  Call or return to clinic if symptoms are not improving.  - amoxicillin-clavulanate (AUGMENTIN) 875-125 MG tablet; Take 1 tablet by mouth 2 (two) times daily.  Dispense: 14 tablet; Refill: 0 - chlorpheniramine-HYDROcodone (TUSSIONEX PENNKINETIC ER) 10-8 MG/5ML SUER; Take 5 mLs by mouth every 12 (twelve) hours as needed for cough.  Dispense: 140 mL; Refill: 0  2. Bronchospasm Tussionex sent. Albuterol as directed. Increase fluids and rest.  - chlorpheniramine-HYDROcodone (TUSSIONEX PENNKINETIC ER) 10-8 MG/5ML SUER; Take 5 mLs by mouth every 12 (twelve) hours as needed for cough.  Dispense: 140 mL; Refill: 0   Leeanne Rio, PA-C

## 2017-03-29 NOTE — Patient Instructions (Signed)
Please take antibiotic as directed.  Increase fluid intake.  Use Saline nasal spray to flush out nasal passages Take a daily multivitamin. Claritin-D to help with sinus congestion. Albuterol as needed for chest tightness or wheeze. Use the Tussionex to help with cough. No driving while on this cough syrup as can make you sleepy. Can use Delsym for daytime cough instead if needed Place a humidifier in the bedroom.  Please call or return clinic if symptoms are not improving.  Sinusitis Sinusitis is redness, soreness, and swelling (inflammation) of the paranasal sinuses. Paranasal sinuses are air pockets within the bones of your face (beneath the eyes, the middle of the forehead, or above the eyes). In healthy paranasal sinuses, mucus is able to drain out, and air is able to circulate through them by way of your nose. However, when your paranasal sinuses are inflamed, mucus and air can become trapped. This can allow bacteria and other germs to grow and cause infection. Sinusitis can develop quickly and last only a short time (acute) or continue over a long period (chronic). Sinusitis that lasts for more than 12 weeks is considered chronic.  CAUSES  Causes of sinusitis include:  Allergies.  Structural abnormalities, such as displacement of the cartilage that separates your nostrils (deviated septum), which can decrease the air flow through your nose and sinuses and affect sinus drainage.  Functional abnormalities, such as when the small hairs (cilia) that line your sinuses and help remove mucus do not work properly or are not present. SYMPTOMS  Symptoms of acute and chronic sinusitis are the same. The primary symptoms are pain and pressure around the affected sinuses. Other symptoms include:  Upper toothache.  Earache.  Headache.  Bad breath.  Decreased sense of smell and taste.  A cough, which worsens when you are lying flat.  Fatigue.  Fever.  Thick drainage from your nose, which often  is green and may contain pus (purulent).  Swelling and warmth over the affected sinuses. DIAGNOSIS  Your caregiver will perform a physical exam. During the exam, your caregiver may:  Look in your nose for signs of abnormal growths in your nostrils (nasal polyps).  Tap over the affected sinus to check for signs of infection.  View the inside of your sinuses (endoscopy) with a special imaging device with a light attached (endoscope), which is inserted into your sinuses. If your caregiver suspects that you have chronic sinusitis, one or more of the following tests may be recommended:  Allergy tests.  Nasal culture A sample of mucus is taken from your nose and sent to a lab and screened for bacteria.  Nasal cytology A sample of mucus is taken from your nose and examined by your caregiver to determine if your sinusitis is related to an allergy. TREATMENT  Most cases of acute sinusitis are related to a viral infection and will resolve on their own within 10 days. Sometimes medicines are prescribed to help relieve symptoms (pain medicine, decongestants, nasal steroid sprays, or saline sprays).  However, for sinusitis related to a bacterial infection, your caregiver will prescribe antibiotic medicines. These are medicines that will help kill the bacteria causing the infection.  Rarely, sinusitis is caused by a fungal infection. In theses cases, your caregiver will prescribe antifungal medicine. For some cases of chronic sinusitis, surgery is needed. Generally, these are cases in which sinusitis recurs more than 3 times per year, despite other treatments. HOME CARE INSTRUCTIONS   Drink plenty of water. Water helps thin the mucus  so your sinuses can drain more easily.  Use a humidifier.  Inhale steam 3 to 4 times a day (for example, sit in the bathroom with the shower running).  Apply a warm, moist washcloth to your face 3 to 4 times a day, or as directed by your caregiver.  Use saline nasal  sprays to help moisten and clean your sinuses.  Take over-the-counter or prescription medicines for pain, discomfort, or fever only as directed by your caregiver. SEEK IMMEDIATE MEDICAL CARE IF:  You have increasing pain or severe headaches.  You have nausea, vomiting, or drowsiness.  You have swelling around your face.  You have vision problems.  You have a stiff neck.  You have difficulty breathing. MAKE SURE YOU:   Understand these instructions.  Will watch your condition.  Will get help right away if you are not doing well or get worse. Document Released: 03/02/2005 Document Revised: 05/25/2011 Document Reviewed: 03/17/2011 [facility] Patient Information 2014 [place], [place].

## 2017-04-01 ENCOUNTER — Other Ambulatory Visit: Payer: Self-pay | Admitting: Family Medicine

## 2017-04-16 ENCOUNTER — Other Ambulatory Visit: Payer: Self-pay | Admitting: Family Medicine

## 2017-04-16 NOTE — Telephone Encounter (Signed)
Last OV 03/10/17 Clonazepam last filled 03/17/17 #60 with 0

## 2017-04-23 LAB — HM DIABETES EYE EXAM

## 2017-04-30 ENCOUNTER — Other Ambulatory Visit: Payer: Self-pay | Admitting: Family Medicine

## 2017-05-13 LAB — HEMOGLOBIN A1C: HEMOGLOBIN A1C: 8.2

## 2017-05-16 ENCOUNTER — Other Ambulatory Visit: Payer: Self-pay | Admitting: Family Medicine

## 2017-05-17 NOTE — Telephone Encounter (Signed)
Last OV 05/11/16 Bronchitis Clonazepam last filled 04/19/17 #60 with 0

## 2017-05-21 LAB — HEMOGLOBIN A1C: Hemoglobin A1C: 7.8

## 2017-06-02 ENCOUNTER — Other Ambulatory Visit: Payer: Self-pay | Admitting: Family Medicine

## 2017-06-07 ENCOUNTER — Other Ambulatory Visit: Payer: Self-pay | Admitting: Family Medicine

## 2017-06-18 ENCOUNTER — Other Ambulatory Visit: Payer: Self-pay | Admitting: Family Medicine

## 2017-06-21 NOTE — Telephone Encounter (Signed)
Last OV 03/10/17 Clonazepam last filled 05/17/17 #60 with 0

## 2017-06-27 ENCOUNTER — Other Ambulatory Visit: Payer: Self-pay | Admitting: Family Medicine

## 2017-07-13 ENCOUNTER — Encounter: Payer: Self-pay | Admitting: Family Medicine

## 2017-07-19 ENCOUNTER — Ambulatory Visit: Payer: BLUE CROSS/BLUE SHIELD | Admitting: Family Medicine

## 2017-07-21 ENCOUNTER — Other Ambulatory Visit: Payer: Self-pay

## 2017-07-21 ENCOUNTER — Ambulatory Visit: Payer: BLUE CROSS/BLUE SHIELD | Admitting: Family Medicine

## 2017-07-21 ENCOUNTER — Encounter: Payer: Self-pay | Admitting: Family Medicine

## 2017-07-21 ENCOUNTER — Other Ambulatory Visit: Payer: Self-pay | Admitting: Family Medicine

## 2017-07-21 VITALS — BP 112/71 | HR 65 | Resp 16 | Ht 66.0 in | Wt 180.4 lb

## 2017-07-21 DIAGNOSIS — G43709 Chronic migraine without aura, not intractable, without status migrainosus: Secondary | ICD-10-CM | POA: Diagnosis not present

## 2017-07-21 MED ORDER — RIZATRIPTAN BENZOATE 10 MG PO TABS: 10.0000 mg | ORAL_TABLET | ORAL | 6 refills | Status: AC | PRN

## 2017-07-21 MED ORDER — TOPIRAMATE 50 MG PO TABS: 50.0000 mg | ORAL_TABLET | Freq: Two times a day (BID) | ORAL | 3 refills | Status: AC

## 2017-07-21 MED ORDER — TOPIRAMATE 25 MG PO TABS: ORAL_TABLET | ORAL | 0 refills | Status: DC

## 2017-07-21 NOTE — Progress Notes (Signed)
   Subjective:    Patient ID: Michelle Riley, female    DOB: 10-Apr-1985, 32 y.o.   MRN: [id number]  HPI 'constant migraines'- pt reports HAs are no longer unilateral and are occurring across the forehead and then radiate down L side of back of head.  + dizziness, nausea, some vomiting.  No visual changes.  + photophobia.  Pt has taken propranolol in the past w/ some success but her BP tends to run low.  Pt has been on Topamax but that we years ago.  Pt is having multiple headaches weekly.  In the past, taking Imitrex at onset was effective but over the last month, this has not been the case.  Last migraine 2 days ago.   Review of Systems For ROS see HPI     Objective:   Physical Exam  Constitutional: She is oriented to person, place, and time. She appears well-developed and well-nourished. No distress.  HENT:  Head: Normocephalic and atraumatic.  TMs WNL No TTP over sinuses Minimal nasal congestion  Eyes: Pupils are equal, round, and reactive to light. Conjunctivae and EOM are normal.  Neck: Normal range of motion. Neck supple.  Cardiovascular: Normal rate, regular rhythm, normal heart sounds and intact distal pulses.  Pulmonary/Chest: Effort normal and breath sounds normal. No respiratory distress. She has no wheezes. She has no rales.  Lymphadenopathy:    She has no cervical adenopathy.  Neurological: She is alert and oriented to person, place, and time. She has normal reflexes. No cranial nerve deficit. Coordination normal.  Psychiatric: She has a normal mood and affect. Her behavior is normal. Judgment and thought content normal.  Vitals reviewed.         Assessment & Plan:

## 2017-07-21 NOTE — Assessment & Plan Note (Signed)
Deteriorated.  Pt is now having multiple migraines weekly.  Based on this, will start Topamax as a preventative medication.  Will hold off on beta blocker due to low HR and low BP.  Switch Imitrex to Maxalt as this was no longer effective.  If no improvement in frequency or severity of headaches, will refer to neuro.  Pt expressed understanding and is in agreement w/ plan.

## 2017-07-21 NOTE — Telephone Encounter (Signed)
Last OV 03/29/17, Next OV Today at 11:30am  Last filled 06/21/17, # 60 with 0 refills

## 2017-07-21 NOTE — Patient Instructions (Signed)
Follow up in 1 month to recheck migraines Please keep a headache journal so we can chart your migraines for improvement Ramp up the Topamax as directed using the  daily and when you get to the  twice daily dose, you can use the  pills (1 pill twice daily) Use the Maxalt as needed for rescue medication Call with any questions or concerns Hang in there!!

## 2017-07-28 ENCOUNTER — Other Ambulatory Visit: Payer: Self-pay | Admitting: General Practice

## 2017-07-28 ENCOUNTER — Encounter: Payer: Self-pay | Admitting: Family Medicine

## 2017-07-28 MED ORDER — TOPIRAMATE 25 MG PO TABS: ORAL_TABLET | ORAL | 0 refills | Status: AC

## 2017-07-28 NOTE — Telephone Encounter (Signed)
Please advise you just filled this medication topiramate  to her local pharmacy at last visit.   Pt is now requesting this to be filled to [facility] in [place]?

## 2017-07-30 ENCOUNTER — Encounter: Payer: Self-pay | Admitting: General Practice

## 2017-07-30 MED ORDER — CLONAZEPAM 1 MG PO TABS: 1.0000 mg | ORAL_TABLET | Freq: Two times a day (BID) | ORAL | 0 refills | Status: DC | PRN

## 2017-08-20 ENCOUNTER — Ambulatory Visit: Payer: Self-pay | Admitting: Family Medicine

## 2017-08-23 DIAGNOSIS — R0602 Shortness of breath: Secondary | ICD-10-CM | POA: Diagnosis not present

## 2017-08-23 DIAGNOSIS — N159 Renal tubulo-interstitial disease, unspecified: Secondary | ICD-10-CM | POA: Diagnosis not present

## 2017-08-25 ENCOUNTER — Ambulatory Visit: Payer: BLUE CROSS/BLUE SHIELD | Admitting: Physician Assistant

## 2017-08-25 ENCOUNTER — Other Ambulatory Visit: Payer: Self-pay

## 2017-08-25 ENCOUNTER — Encounter: Payer: Self-pay | Admitting: Physician Assistant

## 2017-08-25 ENCOUNTER — Encounter: Payer: Self-pay | Admitting: Emergency Medicine

## 2017-08-25 VITALS — BP 100/70 | HR 96 | Temp 98.7°F | Resp 16 | Ht 66.0 in | Wt 171.0 lb

## 2017-08-25 DIAGNOSIS — B349 Viral infection, unspecified: Secondary | ICD-10-CM | POA: Diagnosis not present

## 2017-08-25 LAB — POCT URINALYSIS DIPSTICK
BILIRUBIN UA: NEGATIVE
Blood, UA: NEGATIVE
Glucose, UA: NEGATIVE
KETONES UA: NEGATIVE
Leukocytes, UA: NEGATIVE
Nitrite, UA: NEGATIVE
Protein, UA: NEGATIVE
SPEC GRAV UA: 1.02 (ref 1.010–1.025)
UROBILINOGEN UA: 0.2 U/dL
pH, UA: 5 (ref 5.0–8.0)

## 2017-08-25 MED ORDER — PROMETHAZINE HCL 25 MG PO TABS: 25.0000 mg | ORAL_TABLET | Freq: Three times a day (TID) | ORAL | 0 refills | Status: AC | PRN

## 2017-08-25 NOTE — Patient Instructions (Signed)
Please go to the lab today for blood work.  I will call you with your results. We will alter treatment regimen(s) if indicated by your results.   Please keep well-hydrated and get plenty of rest. Keep close eye on glucose levels and follow instructions by your endocrinologist.  I would recommend holding the Ciprofloxacin since it is making you nauseated and you have no UTI symptoms. I am sending a new prescription for a higher dosage of the promethazine.   I will call as soon as we get results.

## 2017-08-25 NOTE — Progress Notes (Signed)
Patient presents to clinic today do discuss constellation of symptoms that have been present x 1.5 weeks. Patient endorses flu-like symptoms of aches, chills, fever and sore throat starting 10 days ago. Had assessment at Urgent Care 2 days into symptoms. Was told symptoms seemed viral in nature. Supportive measures reviewed with patient. Patient noted worsening symptoms and the start of more pronounced fatigue. As such she returned to another Urgent Care and had a UA that showed some mild evidence of infection and significant glucose (patient is a Type I diabetic). Patient states she has had no urinary symptoms but was started on Ciprofloxacin after which she noted loose stools and abdominal cramping. States she was told due to fatigue and glucosuria, she should go to ER due to potential DKA. Notes sugars have been mildly elevated but no severe elevations. Was given fluids in ER but workup otherwise unremarkable. Is here today due to ongoing symptoms. Is concerned that she may have a recurrence of mono that she had several years ago.  Past Medical History:  Diagnosis Date  . Anxiety   . Chronic benign neutropenia (HCC)   . Diabetes mellitus without complication (HCC)   . Severe major depression (HCC)     Current Outpatient Medications on File Prior to Visit  Medication Sig Dispense Refill  . ciprofloxacin (CIPRO) 500 MG tablet   0  . clonazePAM (KLONOPIN) 1 MG tablet Take 1 tablet (1 mg total) by mouth 2 (two) times daily as needed. for anxiety 60 tablet 0  . GLUCAGEN HYPOKIT 1 MG SOLR injection Inject 1 application into the skin once. FOR HYPOGLYCEMIA  11  . insulin aspart (NOVOLOG) 100 UNIT/ML injection Inject into the skin 3 (three) times daily before meals. Through insulin pump, varies    . LEVEMIR 100 UNIT/ML injection   0  . Levonorgestrel (SKYLA) 13.5 MG IUD Skyla 14 mcg/24 hrs (3 yrs) 13.5 mg intrauterine device    . ondansetron (ZOFRAN) 8 MG tablet   5  . rizatriptan (MAXALT) 10 MG  tablet Take 1 tablet (10 mg total) by mouth as needed for migraine. May repeat in 2 hours if needed 10 tablet 6  . sertraline (ZOLOFT) 100 MG tablet TAKE 1 TABLET(100 MG) BY MOUTH DAILY 30 tablet 3  . topiramate (TOPAMAX) 25 MG tablet Take 1 tab QHS x1 week, then 1 tab BID x1 week, then 1 tab QAM and 2 tabs QHS x1 week, then 2 tabs BID 120 tablet 0  . topiramate (TOPAMAX) 50 MG tablet Take 1 tablet (50 mg total) by mouth 2 (two) times daily. (Patient not taking: Reported on 08/25/2017) 60 tablet 3   No current facility-administered medications on file prior to visit.     Allergies  Allergen Reactions  . Other Other (See Comments)    All Steroids cause DKA  . Paxil [Paroxetine] Other (See Comments)    Suicidal tendencies (abrupt change from lexapro to paxil 10/10/14)   . Prednisone Other (See Comments)    Pt is type 1 diabetic and could go into DKA.     Family History  Problem Relation Age of Onset  . Diabetes Mellitus II Maternal Grandmother   . Diabetes Mellitus II Paternal Grandfather   . Leukemia Paternal Grandfather   . Hypertension Mother     Social History   Socioeconomic History  . Marital status: Single    Spouse name: Not on file  . Number of children: Not on file  . Years of education: Not on file  .  Highest education level: Not on file  Occupational History  . Not on file  Social Needs  . Financial resource strain: Not on file  . Food insecurity:    Worry: Not on file    Inability: Not on file  . Transportation needs:    Medical: Not on file    Non-medical: Not on file  Tobacco Use  . Smoking status: Never Smoker  . Smokeless tobacco: Never Used  Substance and Sexual Activity  . Alcohol use: No  . Drug use: No  . Sexual activity: Yes    Birth control/protection: IUD  Lifestyle  . Physical activity:    Days per week: Not on file    Minutes per session: Not on file  . Stress: Not on file  Relationships  . Social connections:    Talks on phone: Not on  file    Gets together: Not on file    Attends religious service: Not on file    Active member of club or organization: Not on file    Attends meetings of clubs or organizations: Not on file    Relationship status: Not on file  Other Topics Concern  . Not on file  Social History Narrative  . Not on file   Review of Systems - See HPI.  All other ROS are negative.  BP 100/70   Pulse 96   Temp 98.7 F (37.1 C) (Oral)   Resp 16   Ht 5\' 6"  (1.676 m)   Wt 171 lb (77.6 kg)   SpO2 98%   BMI 27.60 kg/m   Physical Exam  Constitutional: She is oriented to person, place, and time. She appears well-developed and well-nourished. No distress.  HENT:  Head: Normocephalic and atraumatic.  Right Ear: External ear normal.  Left Ear: External ear normal.  Nose: Nose normal.  Mouth/Throat: Oropharynx is clear and moist.  TM within normal limits bilaterally.  Eyes: Pupils are equal, round, and reactive to light. Conjunctivae are normal.  Neck: Neck supple.  Cardiovascular: Normal rate, regular rhythm, normal heart sounds and intact distal pulses.  Pulmonary/Chest: Effort normal and breath sounds normal. No stridor. No respiratory distress. She has no wheezes. She has no rales. She exhibits no tenderness.  Abdominal: Soft. Bowel sounds are normal. She exhibits no distension. There is no tenderness.  Lymphadenopathy:    She has no cervical adenopathy.  Neurological: She is alert and oriented to person, place, and time.  Skin: She is not diaphoretic.  Psychiatric: She has a normal mood and affect.  Vitals reviewed.  Assessment/Plan: 1. Viral illness Seems most likely cause of current symptoms. Suspect exacerbation of GI symptoms secondary to Cipro given for what seems to be asymptomatic bacteriuria. Will stop Cipro. Workup at [facility] and ER thus far unremarkable. Will check labs to include EBV and CMV panels today. Will obtain urine culture to further assess need for ant  - CBC w/Diff - POCT  urinalysis dipstick - Epstein-Barr virus VCA antibody panel - CMV IgM - Urine Culture; Future - Urine Culture   Piedad Climes, PA-C

## 2017-08-26 ENCOUNTER — Encounter: Payer: Self-pay | Admitting: Physician Assistant

## 2017-08-26 LAB — URINE CULTURE
MICRO NUMBER: 90706169
SPECIMEN QUALITY:: ADEQUATE

## 2017-08-26 LAB — CBC WITH DIFFERENTIAL/PLATELET
BASOS PCT: 1.1 % (ref 0.0–3.0)
Basophils Absolute: 0.1 10*3/uL (ref 0.0–0.1)
EOS ABS: 0 10*3/uL (ref 0.0–0.7)
EOS PCT: 0.8 % (ref 0.0–5.0)
HCT: 40.3 % (ref 36.0–46.0)
HEMOGLOBIN: 13.5 g/dL (ref 12.0–15.0)
LYMPHS ABS: 2.2 10*3/uL (ref 0.7–4.0)
Lymphocytes Relative: 37.4 % (ref 12.0–46.0)
MCHC: 33.4 g/dL (ref 30.0–36.0)
MCV: 93.4 fl (ref 78.0–100.0)
Monocytes Absolute: 0.3 10*3/uL (ref 0.1–1.0)
Monocytes Relative: 5.4 % (ref 3.0–12.0)
Neutro Abs: 3.2 10*3/uL (ref 1.4–7.7)
Neutrophils Relative %: 55.3 % (ref 43.0–77.0)
Platelets: 135 10*3/uL — ABNORMAL LOW (ref 150.0–400.0)
RBC: 4.31 Mil/uL (ref 3.87–5.11)
RDW: 13.6 % (ref 11.5–15.5)
WBC: 5.8 10*3/uL (ref 4.0–10.5)

## 2017-08-27 LAB — EPSTEIN-BARR VIRUS VCA ANTIBODY PANEL
EBV NA IgG: <18 U/mL
EBV VCA IGG: 21.4 U/mL — AB
EBV VCA IgM: <36 U/mL

## 2017-08-27 LAB — CMV IGM

## 2017-09-01 ENCOUNTER — Encounter: Payer: Self-pay | Admitting: Emergency Medicine

## 2017-09-06 ENCOUNTER — Encounter: Payer: Self-pay | Admitting: General Practice

## 2017-09-06 ENCOUNTER — Encounter: Payer: Self-pay | Admitting: Family Medicine

## 2017-09-10 ENCOUNTER — Other Ambulatory Visit: Payer: Self-pay | Admitting: Family Medicine

## 2017-09-13 MED ORDER — SERTRALINE HCL 100 MG PO TABS: 150.0000 mg | ORAL_TABLET | Freq: Every day | ORAL | 3 refills | Status: AC

## 2017-09-13 MED ORDER — BUSPIRONE HCL 10 MG PO TABS: 10.0000 mg | ORAL_TABLET | Freq: Two times a day (BID) | ORAL | 3 refills | Status: AC

## 2017-09-13 NOTE — Telephone Encounter (Signed)
Last OV 08/25/17 Clonazepam last filled 07/30/17 #60 with 0

## 2017-09-21 MED ORDER — TRAZODONE HCL 50 MG PO TABS: 25.0000 mg | ORAL_TABLET | Freq: Every evening | ORAL | 0 refills | Status: DC | PRN

## 2017-09-21 NOTE — Addendum Note (Signed)
Addended by: Sheliah HatchABORI, Haydan Mansouri E on: 09/21/2017 11:14 AM   Modules accepted: Orders

## 2017-09-29 DIAGNOSIS — F4323 Adjustment disorder with mixed anxiety and depressed mood: Secondary | ICD-10-CM | POA: Diagnosis not present

## 2017-10-04 DIAGNOSIS — F4323 Adjustment disorder with mixed anxiety and depressed mood: Secondary | ICD-10-CM | POA: Diagnosis not present

## 2017-10-10 ENCOUNTER — Other Ambulatory Visit: Payer: Self-pay | Admitting: Family Medicine

## 2017-10-10 IMAGING — US US ABDOMEN LIMITED
1 series · 14 of 25 positions shown · non-contrast
Comparison: CT scan 09/12/2015

CLINICAL DATA: Abdominal pain

EXAM:
US ABDOMEN LIMITED - RIGHT UPPER QUADRANT

[Series 1: us abdomen limited · 0.24mm/px · 14 of 45 slices shown]
[im 1/45]
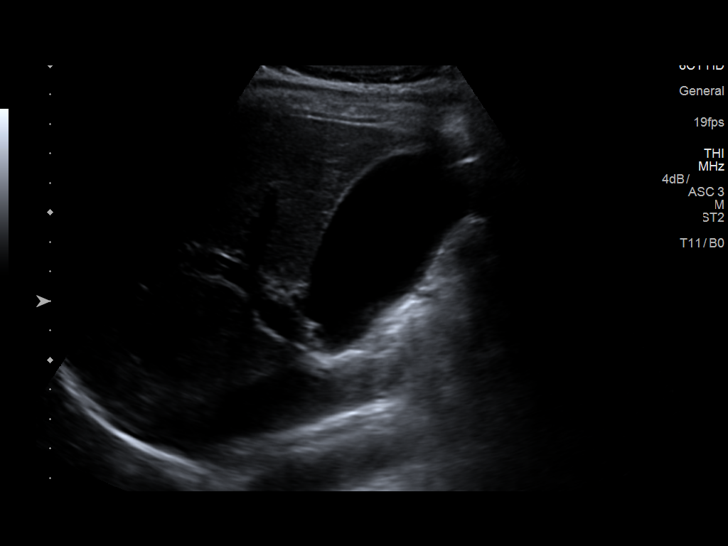
[im 4/45]
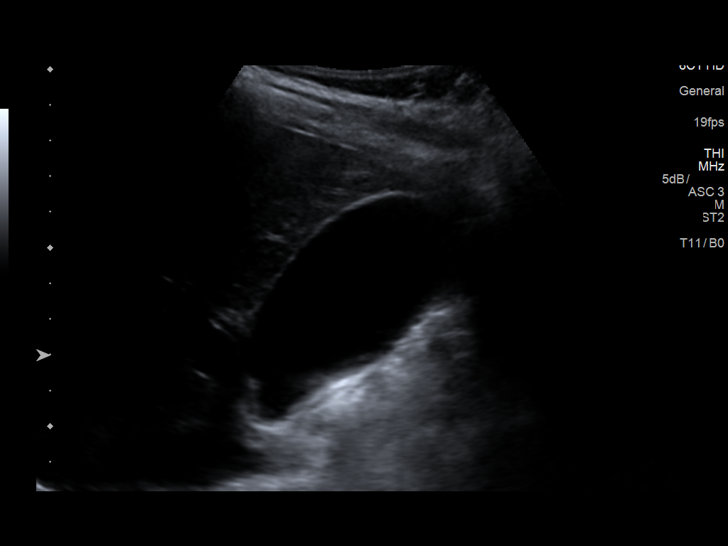
[im 8/45]
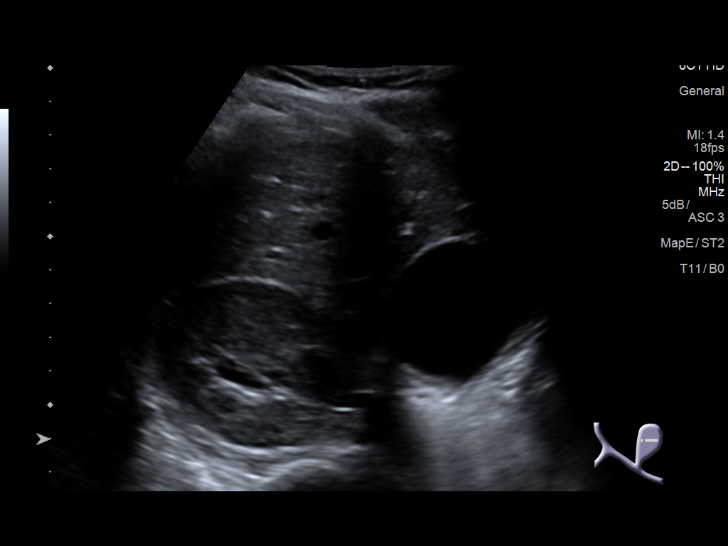
[im 12/45]
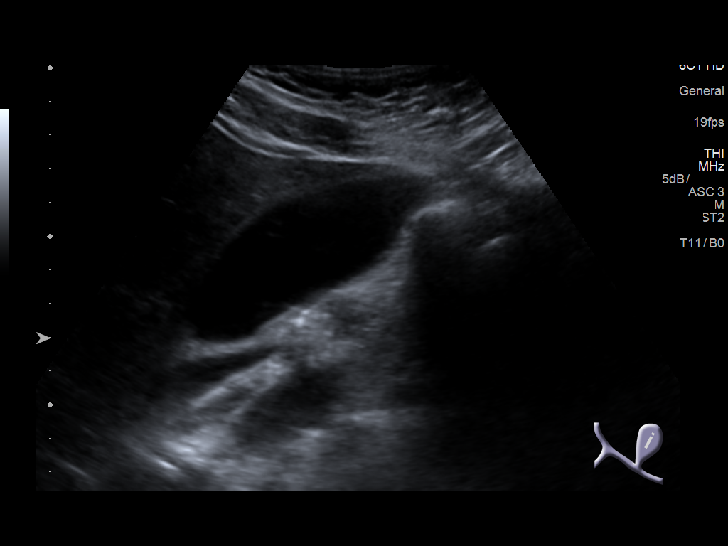
[im 15/45]
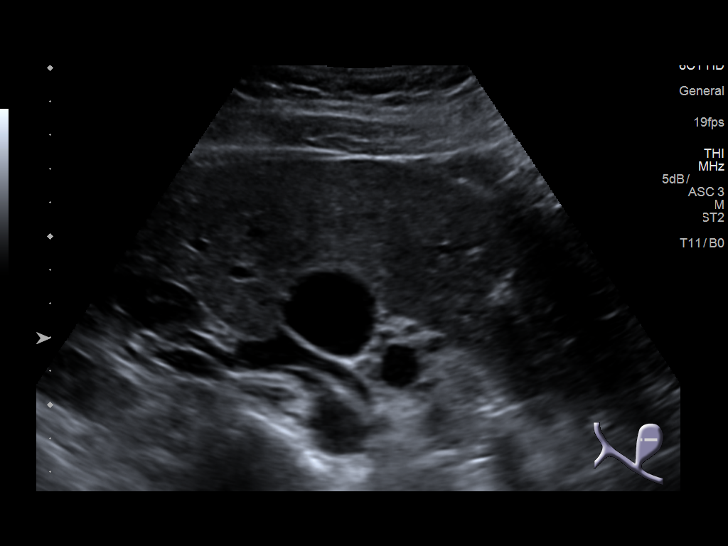
[im 17/45]
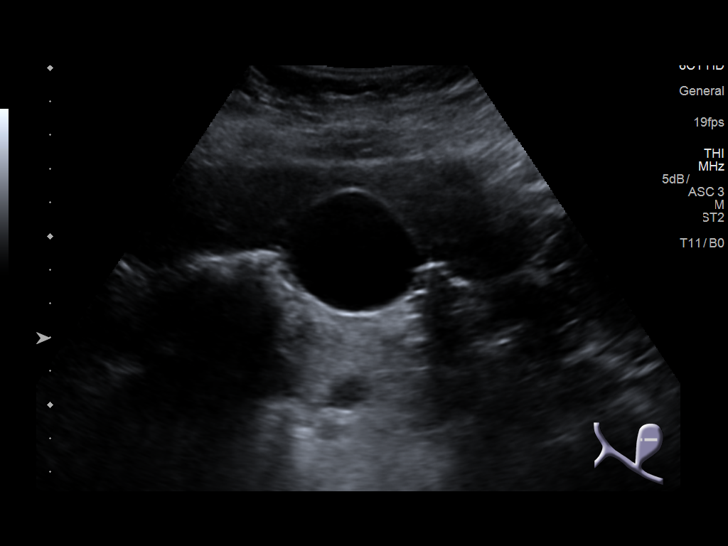
[im 21/45]
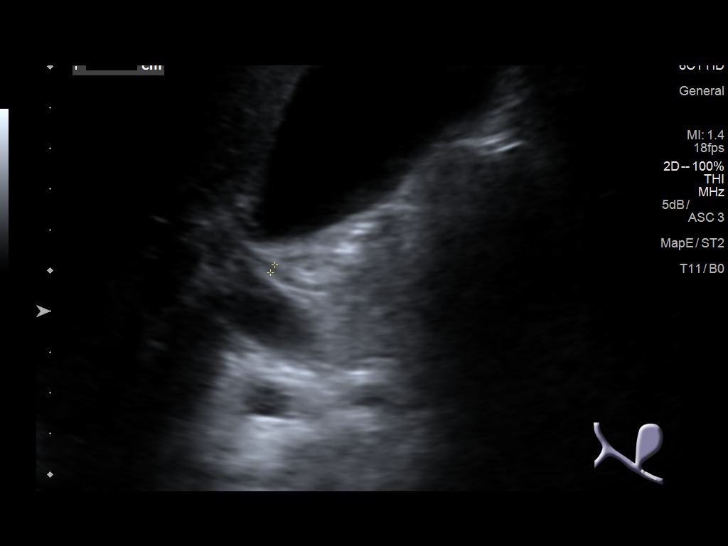
[im 24/45]
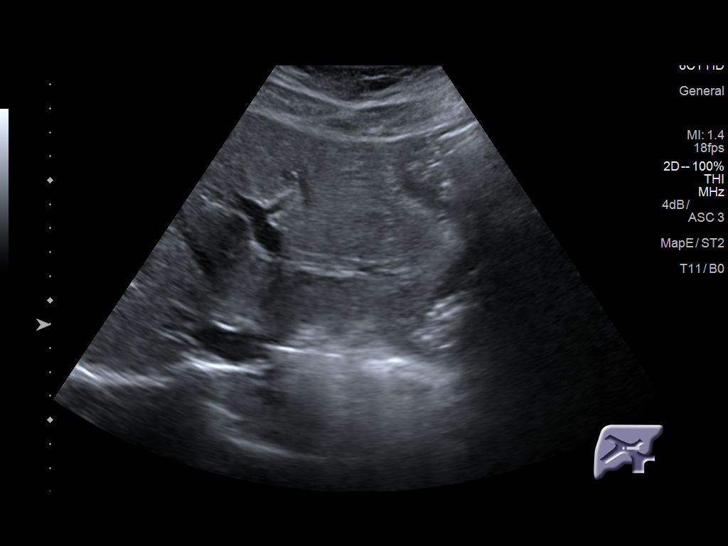
[im 28/45]
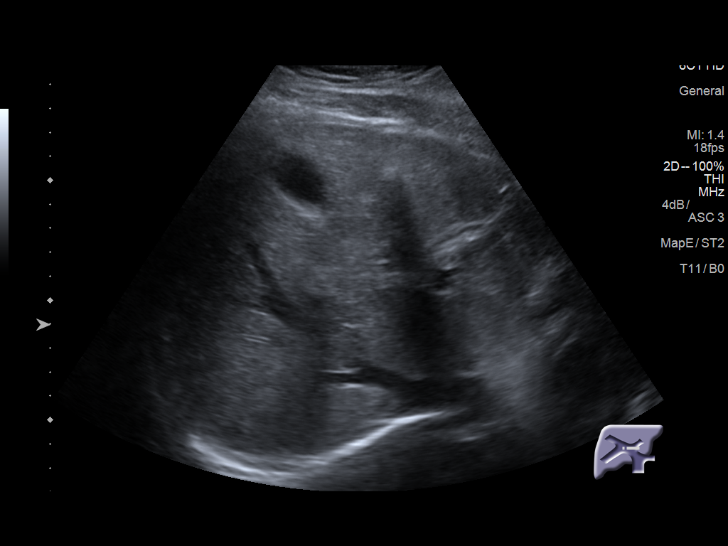
[im 30/45]
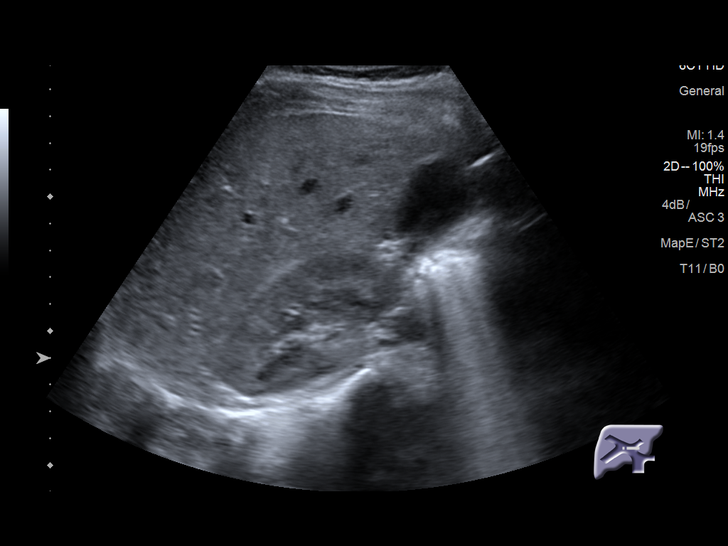
[im 34/45]
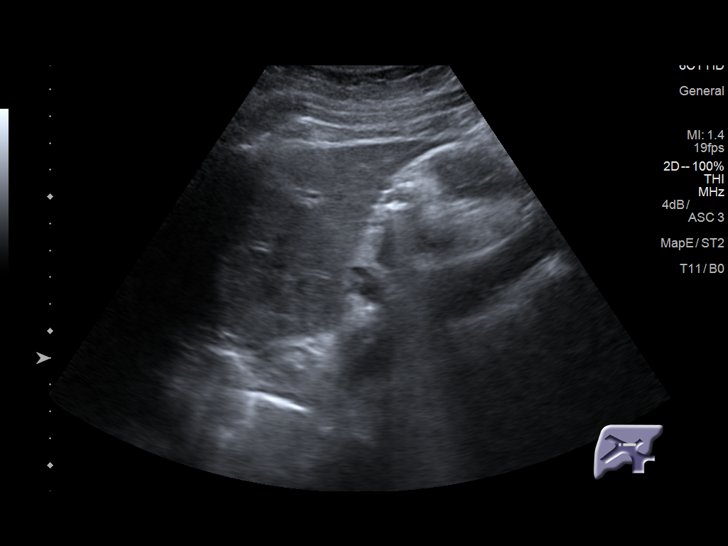
[im 37/45]
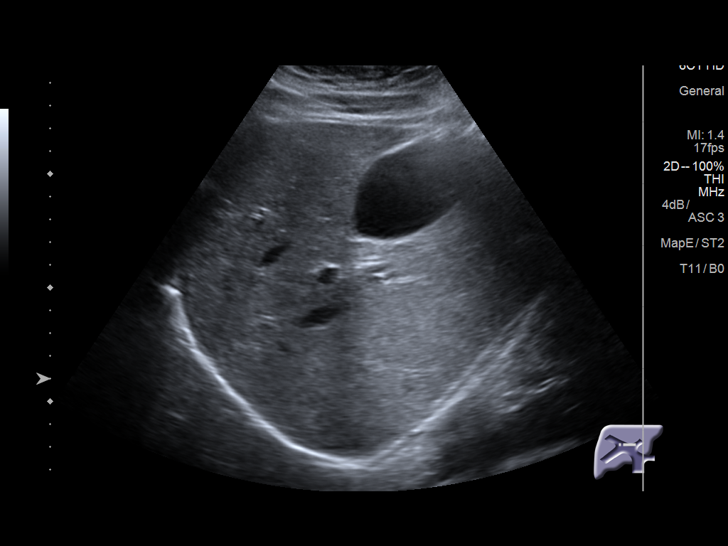
[im 41/45]
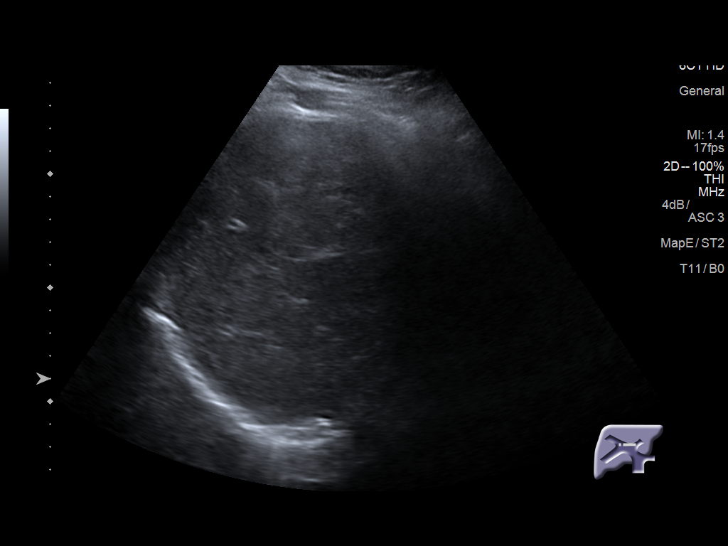
[im 45/45]
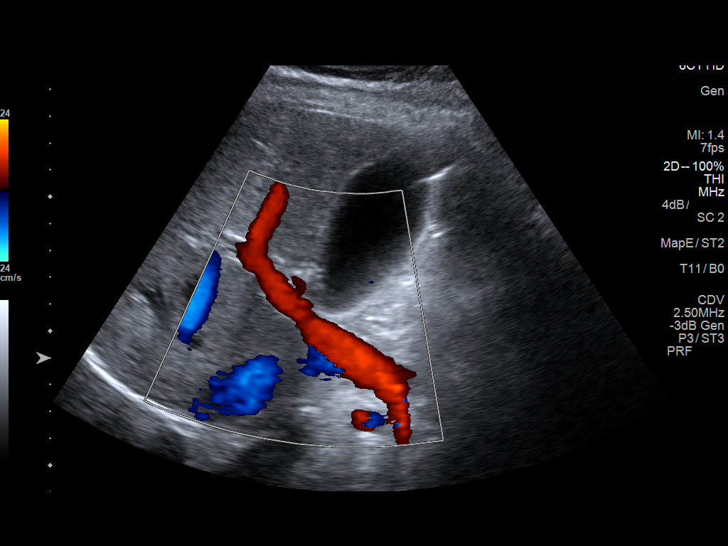

[14 of 25 positions shown; findings below may reference images not displayed]

FINDINGS: Gallbladder:

No gallstones or wall thickening visualized. No sonographic Murphy
sign noted by sonographer.

Common bile duct:

Diameter: 2.2 mm

Liver:

No focal lesion identified. Within normal limits in parenchymal
echogenicity.
IMPRESSION: Unremarkable right upper quadrant ultrasound.

## 2017-10-11 NOTE — Telephone Encounter (Signed)
Last OV 08/25/17 Clonazepam last filled 09/13/17 #60 with 0

## 2017-10-15 DIAGNOSIS — F4323 Adjustment disorder with mixed anxiety and depressed mood: Secondary | ICD-10-CM | POA: Diagnosis not present

## 2017-10-17 ENCOUNTER — Other Ambulatory Visit: Payer: Self-pay | Admitting: Family Medicine

## 2017-10-22 ENCOUNTER — Other Ambulatory Visit: Payer: Self-pay | Admitting: Family Medicine

## 2017-10-22 ENCOUNTER — Encounter: Payer: Self-pay | Admitting: Family Medicine

## 2017-10-22 DIAGNOSIS — M25519 Pain in unspecified shoulder: Secondary | ICD-10-CM

## 2017-10-25 ENCOUNTER — Encounter: Payer: Self-pay | Admitting: Family Medicine

## 2017-10-27 DIAGNOSIS — F4323 Adjustment disorder with mixed anxiety and depressed mood: Secondary | ICD-10-CM | POA: Diagnosis not present

## 2017-10-29 DIAGNOSIS — M25512 Pain in left shoulder: Secondary | ICD-10-CM | POA: Diagnosis not present

## 2017-11-01 DIAGNOSIS — E109 Type 1 diabetes mellitus without complications: Secondary | ICD-10-CM | POA: Diagnosis not present

## 2017-11-01 DIAGNOSIS — Z794 Long term (current) use of insulin: Secondary | ICD-10-CM | POA: Diagnosis not present

## 2017-11-01 DIAGNOSIS — E108 Type 1 diabetes mellitus with unspecified complications: Secondary | ICD-10-CM | POA: Diagnosis not present

## 2017-11-01 DIAGNOSIS — R079 Chest pain, unspecified: Secondary | ICD-10-CM | POA: Diagnosis not present

## 2017-11-01 DIAGNOSIS — R918 Other nonspecific abnormal finding of lung field: Secondary | ICD-10-CM | POA: Diagnosis not present

## 2017-11-01 DIAGNOSIS — E1065 Type 1 diabetes mellitus with hyperglycemia: Secondary | ICD-10-CM | POA: Diagnosis not present

## 2017-11-01 DIAGNOSIS — M7552 Bursitis of left shoulder: Secondary | ICD-10-CM | POA: Diagnosis not present

## 2017-11-01 DIAGNOSIS — R0602 Shortness of breath: Secondary | ICD-10-CM | POA: Diagnosis not present

## 2017-11-01 DIAGNOSIS — R358 Other polyuria: Secondary | ICD-10-CM | POA: Diagnosis not present

## 2017-11-01 DIAGNOSIS — R0600 Dyspnea, unspecified: Secondary | ICD-10-CM | POA: Diagnosis not present

## 2017-11-02 DIAGNOSIS — R079 Chest pain, unspecified: Secondary | ICD-10-CM | POA: Diagnosis not present

## 2017-11-02 DIAGNOSIS — R0600 Dyspnea, unspecified: Secondary | ICD-10-CM | POA: Diagnosis not present

## 2017-11-05 ENCOUNTER — Other Ambulatory Visit: Payer: Self-pay | Admitting: Family Medicine

## 2017-11-05 NOTE — Telephone Encounter (Signed)
Last OV 08/25/17, No future OV  Medication not currently on Med list.

## 2017-11-07 ENCOUNTER — Other Ambulatory Visit: Payer: Self-pay | Admitting: Family Medicine

## 2017-11-08 NOTE — Telephone Encounter (Signed)
Dr. Tabori's patient 

## 2017-11-11 DIAGNOSIS — E109 Type 1 diabetes mellitus without complications: Secondary | ICD-10-CM | POA: Diagnosis not present

## 2017-11-11 DIAGNOSIS — F419 Anxiety disorder, unspecified: Secondary | ICD-10-CM | POA: Diagnosis not present

## 2017-11-12 DIAGNOSIS — E1065 Type 1 diabetes mellitus with hyperglycemia: Secondary | ICD-10-CM | POA: Diagnosis not present

## 2017-11-12 DIAGNOSIS — F4323 Adjustment disorder with mixed anxiety and depressed mood: Secondary | ICD-10-CM | POA: Diagnosis not present

## 2017-11-19 DIAGNOSIS — F4323 Adjustment disorder with mixed anxiety and depressed mood: Secondary | ICD-10-CM | POA: Diagnosis not present

## 2017-11-22 DIAGNOSIS — Z0000 Encounter for general adult medical examination without abnormal findings: Secondary | ICD-10-CM | POA: Diagnosis not present

## 2017-11-22 DIAGNOSIS — G43909 Migraine, unspecified, not intractable, without status migrainosus: Secondary | ICD-10-CM | POA: Diagnosis not present

## 2017-12-02 DIAGNOSIS — Z23 Encounter for immunization: Secondary | ICD-10-CM | POA: Diagnosis not present

## 2017-12-02 DIAGNOSIS — B977 Papillomavirus as the cause of diseases classified elsewhere: Secondary | ICD-10-CM | POA: Diagnosis not present

## 2017-12-02 DIAGNOSIS — R87810 Cervical high risk human papillomavirus (HPV) DNA test positive: Secondary | ICD-10-CM | POA: Diagnosis not present

## 2017-12-10 DIAGNOSIS — F411 Generalized anxiety disorder: Secondary | ICD-10-CM | POA: Diagnosis not present

## 2017-12-17 DIAGNOSIS — F4323 Adjustment disorder with mixed anxiety and depressed mood: Secondary | ICD-10-CM | POA: Diagnosis not present

## 2017-12-21 DIAGNOSIS — F411 Generalized anxiety disorder: Secondary | ICD-10-CM | POA: Diagnosis not present

## 2017-12-24 DIAGNOSIS — F4323 Adjustment disorder with mixed anxiety and depressed mood: Secondary | ICD-10-CM | POA: Diagnosis not present

## 2017-12-28 DIAGNOSIS — Z794 Long term (current) use of insulin: Secondary | ICD-10-CM | POA: Diagnosis not present

## 2017-12-28 DIAGNOSIS — E1065 Type 1 diabetes mellitus with hyperglycemia: Secondary | ICD-10-CM | POA: Diagnosis not present

## 2017-12-28 DIAGNOSIS — E109 Type 1 diabetes mellitus without complications: Secondary | ICD-10-CM | POA: Diagnosis not present

## 2017-12-28 DIAGNOSIS — E108 Type 1 diabetes mellitus with unspecified complications: Secondary | ICD-10-CM | POA: Diagnosis not present

## 2017-12-29 DIAGNOSIS — Z794 Long term (current) use of insulin: Secondary | ICD-10-CM | POA: Diagnosis not present

## 2017-12-29 DIAGNOSIS — J069 Acute upper respiratory infection, unspecified: Secondary | ICD-10-CM | POA: Diagnosis not present

## 2017-12-29 DIAGNOSIS — J028 Acute pharyngitis due to other specified organisms: Secondary | ICD-10-CM | POA: Diagnosis not present

## 2017-12-29 DIAGNOSIS — Z79899 Other long term (current) drug therapy: Secondary | ICD-10-CM | POA: Diagnosis not present

## 2017-12-29 DIAGNOSIS — E1065 Type 1 diabetes mellitus with hyperglycemia: Secondary | ICD-10-CM | POA: Diagnosis not present

## 2017-12-29 DIAGNOSIS — E1010 Type 1 diabetes mellitus with ketoacidosis without coma: Secondary | ICD-10-CM | POA: Diagnosis not present

## 2017-12-29 DIAGNOSIS — R509 Fever, unspecified: Secondary | ICD-10-CM | POA: Diagnosis not present

## 2017-12-31 DIAGNOSIS — F4323 Adjustment disorder with mixed anxiety and depressed mood: Secondary | ICD-10-CM | POA: Diagnosis not present

## 2018-01-03 DIAGNOSIS — F4323 Adjustment disorder with mixed anxiety and depressed mood: Secondary | ICD-10-CM | POA: Diagnosis not present

## 2018-01-03 DIAGNOSIS — E1065 Type 1 diabetes mellitus with hyperglycemia: Secondary | ICD-10-CM | POA: Diagnosis not present

## 2018-01-07 DIAGNOSIS — G43709 Chronic migraine without aura, not intractable, without status migrainosus: Secondary | ICD-10-CM | POA: Diagnosis not present

## 2018-01-19 DIAGNOSIS — F4323 Adjustment disorder with mixed anxiety and depressed mood: Secondary | ICD-10-CM | POA: Diagnosis not present

## 2018-01-31 DIAGNOSIS — Z794 Long term (current) use of insulin: Secondary | ICD-10-CM | POA: Diagnosis not present

## 2018-01-31 DIAGNOSIS — E108 Type 1 diabetes mellitus with unspecified complications: Secondary | ICD-10-CM | POA: Diagnosis not present

## 2018-01-31 DIAGNOSIS — E109 Type 1 diabetes mellitus without complications: Secondary | ICD-10-CM | POA: Diagnosis not present

## 2018-01-31 DIAGNOSIS — E1065 Type 1 diabetes mellitus with hyperglycemia: Secondary | ICD-10-CM | POA: Diagnosis not present

## 2018-02-02 DIAGNOSIS — F4323 Adjustment disorder with mixed anxiety and depressed mood: Secondary | ICD-10-CM | POA: Diagnosis not present

## 2018-02-18 DIAGNOSIS — F4323 Adjustment disorder with mixed anxiety and depressed mood: Secondary | ICD-10-CM | POA: Diagnosis not present

## 2018-02-23 DIAGNOSIS — F4323 Adjustment disorder with mixed anxiety and depressed mood: Secondary | ICD-10-CM | POA: Diagnosis not present

## 2018-02-28 DIAGNOSIS — F4323 Adjustment disorder with mixed anxiety and depressed mood: Secondary | ICD-10-CM | POA: Diagnosis not present

## 2018-03-07 DIAGNOSIS — E1065 Type 1 diabetes mellitus with hyperglycemia: Secondary | ICD-10-CM | POA: Diagnosis not present

## 2018-03-24 DIAGNOSIS — M898X1 Other specified disorders of bone, shoulder: Secondary | ICD-10-CM | POA: Diagnosis not present

## 2018-03-24 DIAGNOSIS — M25532 Pain in left wrist: Secondary | ICD-10-CM | POA: Diagnosis not present

## 2018-04-04 DIAGNOSIS — J111 Influenza due to unidentified influenza virus with other respiratory manifestations: Secondary | ICD-10-CM | POA: Diagnosis not present

## 2018-04-04 DIAGNOSIS — E109 Type 1 diabetes mellitus without complications: Secondary | ICD-10-CM | POA: Diagnosis not present

## 2018-04-06 DIAGNOSIS — E108 Type 1 diabetes mellitus with unspecified complications: Secondary | ICD-10-CM | POA: Diagnosis not present

## 2018-04-06 DIAGNOSIS — Z794 Long term (current) use of insulin: Secondary | ICD-10-CM | POA: Diagnosis not present

## 2018-04-06 DIAGNOSIS — E109 Type 1 diabetes mellitus without complications: Secondary | ICD-10-CM | POA: Diagnosis not present

## 2018-05-06 ENCOUNTER — Other Ambulatory Visit: Payer: Self-pay | Admitting: Family Medicine

## 2020-09-11 ENCOUNTER — Encounter: Payer: Self-pay | Admitting: *Deleted
